# Patient Record
Sex: Male | Born: 1990 | Race: Black or African American | Hispanic: No | Marital: Single | Smoking: Former smoker
Health system: Southern US, Community
[De-identification: ages and names within clinical notes are randomized; demographics above are authoritative.]

## PROBLEM LIST (undated history)

## (undated) DIAGNOSIS — J45909 Unspecified asthma, uncomplicated: Secondary | ICD-10-CM

## (undated) DIAGNOSIS — J40 Bronchitis, not specified as acute or chronic: Secondary | ICD-10-CM

---
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
## 2013-07-08 ENCOUNTER — Emergency Department: Payer: Self-pay | Admitting: Emergency Medicine

## 2013-08-18 ENCOUNTER — Emergency Department: Payer: Self-pay | Admitting: Emergency Medicine

## 2013-08-18 LAB — URINALYSIS, COMPLETE
Bacteria: NONE SEEN
Bilirubin,UR: NEGATIVE
Blood: NEGATIVE
Leukocyte Esterase: NEGATIVE
Nitrite: NEGATIVE
Ph: 6 (ref 4.5–8.0)
Squamous Epithelial: NONE SEEN

## 2013-11-17 ENCOUNTER — Emergency Department: Payer: Self-pay | Admitting: Emergency Medicine

## 2014-04-14 ENCOUNTER — Emergency Department: Payer: Self-pay | Admitting: Emergency Medicine

## 2014-08-25 ENCOUNTER — Emergency Department: Payer: Self-pay | Admitting: Emergency Medicine

## 2014-08-25 LAB — URINALYSIS, COMPLETE
BLOOD: NEGATIVE
Bacteria: NONE SEEN
Bilirubin,UR: NEGATIVE
GLUCOSE, UR: NEGATIVE mg/dL (ref 0–75)
Ketone: NEGATIVE
LEUKOCYTE ESTERASE: NEGATIVE
NITRITE: NEGATIVE
PROTEIN: NEGATIVE
Ph: 6 (ref 4.5–8.0)
Specific Gravity: 1.026 (ref 1.003–1.030)
WBC UR: 1 /HPF (ref 0–5)

## 2014-08-28 LAB — GC/CHLAMYDIA PROBE AMP

## 2016-06-08 ENCOUNTER — Emergency Department: Payer: Self-pay

## 2016-06-08 ENCOUNTER — Emergency Department
Admission: EM | Admit: 2016-06-08 | Discharge: 2016-06-08 | Disposition: A | Discharge status: Home / Self Care | Payer: Self-pay | Attending: Emergency Medicine | Admitting: Emergency Medicine

## 2016-06-08 ENCOUNTER — Encounter: Payer: Self-pay | Admitting: Emergency Medicine

## 2016-06-08 DIAGNOSIS — F17210 Nicotine dependence, cigarettes, uncomplicated: Secondary | ICD-10-CM | POA: Insufficient documentation

## 2016-06-08 DIAGNOSIS — J069 Acute upper respiratory infection, unspecified: Secondary | ICD-10-CM | POA: Insufficient documentation

## 2016-06-08 DIAGNOSIS — B9789 Other viral agents as the cause of diseases classified elsewhere: Secondary | ICD-10-CM

## 2016-06-08 MED ORDER — PSEUDOEPH-BROMPHEN-DM 30-2-10 MG/5ML PO SYRP: 5.0000 mL | ORAL_SOLUTION | Freq: Four times a day (QID) | ORAL | 0 refills | Status: DC | PRN

## 2016-06-08 NOTE — ED Triage Notes (Signed)
Patient presents to the ED with cough and congestion x 3 weeks.  Patient states he has felt somewhat short of breath and has been using his mother's inhaler occasionally which improves his congestion.  Patient's respirations are even and not labored at this time. Patient speaking in full sentences without difficulty.  Breath sounds are clear bilaterally. Patient reports no history of asthma.  Patient reports coughing up yellow phlegm.  Denies fever.

## 2016-06-08 NOTE — Discharge Instructions (Signed)
Discontinue use of albuterol inhaler.

## 2016-06-08 NOTE — ED Provider Notes (Signed)
[facility] Emergency Department Provider Note   ____________________________________________   None    (approximate)  I have reviewed the triage vital signs and the nursing notes.   HISTORY  Chief Complaint Nasal Congestion and Cough    HPI Mathew Fitzpatrick is a 25 y.o. male patient complaining of cough congestion for 3 weeks. Patient stated this didn't admitting dyspnea. Patient stating he's been used his mother's inhaler which improved his condition. He denies previous history of asthma. Patient's mother has asthma. Patient admits to tobacco use. Patient also states has nasal congestion and intermittently runny nose. Patient say he has productive yellow cough. Patient denies any fever associated this complaint. No other palliative measures for this complaint.Patient denies any pain with this time.   History reviewed. No pertinent past medical history.  There are no active problems to display for this patient.   History reviewed. No pertinent surgical history.  Prior to Admission medications   Medication Sig Start Date End Date Taking? Authorizing Provider  brompheniramine-pseudoephedrine-DM 30-2-10 MG/5ML syrup Take 5 mLs by mouth 4 (four) times daily as needed. 06/08/16   Joni Reiningonald K Genessis Flanary, PA-C    Allergies Review of patient's allergies indicates no known allergies.  No family history on file.  Social History Social History  Substance Use Topics  . Smoking status: Current Every Day Smoker    Packs/day: 0.10    Types: Cigarettes  . Smokeless tobacco: Never Used  . Alcohol use Yes     Comment: daily    Review of Systems Constitutional: No fever/chills Eyes: No visual changes. ENT: No sore throat. Cardiovascular: Denies chest pain. Respiratory:Intermittent shortness of breath. Productive cough. Gastrointestinal: No abdominal pain.  No nausea, no vomiting.  No diarrhea.  No constipation. Genitourinary: Negative for  dysuria. Musculoskeletal: Negative for back pain. Skin: Negative for rash. Neurological: Negative for headaches, focal weakness or numbness.    ____________________________________________   PHYSICAL EXAM:  VITAL SIGNS: ED Triage Vitals  Enc Vitals Group     BP 06/08/16 1106 134/74     Pulse Rate 06/08/16 1106 66     Resp 06/08/16 1106 18     Temp 06/08/16 1106 98 F (36.7 C)     Temp Source 06/08/16 1106 Oral     SpO2 06/08/16 1106 96 %     Weight 06/08/16 1109 185 lb (83.9 kg)     Height 06/08/16 1109 5\' 10"  (1.778 m)     Head Circumference --      Peak Flow --      Pain Score --      Pain Loc --      Pain Edu? --      Excl. in GC? --     Constitutional: Alert and oriented. Well appearing and in no acute distress. Eyes: Conjunctivae are normal. PERRL. EOMI. Head: Atraumatic. Nose: No congestion/rhinnorhea. Mouth/Throat: Mucous membranes are moist.  Oropharynx non-erythematous. Neck: No stridor.  No cervical spine tenderness to palpation. Hematological/Lymphatic/Immunilogical: No cervical lymphadenopathy. Cardiovascular: Normal rate, regular rhythm. Grossly normal heart sounds.  Good peripheral circulation. Respiratory: Normal respiratory effort.  No retractions. Lungs CTAB. Gastrointestinal: Soft and nontender. No distention. No abdominal bruits. No CVA tenderness. Musculoskeletal: No lower extremity tenderness nor edema.  No joint effusions. Neurologic:  Normal speech and language. No gross focal neurologic deficits are appreciated. No gait instability. Skin:  Skin is warm, dry and intact. No rash noted. Psychiatric: Mood and affect are normal. Speech and behavior are normal.  ____________________________________________  LABS (all labs ordered are listed, but only abnormal results are displayed)  Labs Reviewed - No data to display ____________________________________________  EKG   ____________________________________________  RADIOLOGY  No acute  findings on chest x-ray. ____________________________________________   PROCEDURES  Procedure(s) performed:   Procedures  Critical Care performed: No  ____________________________________________   INITIAL IMPRESSION / ASSESSMENT AND PLAN / ED COURSE  Pertinent labs & imaging results that were available during my care of the patient were reviewed by me and considered in my medical decision making (see chart for details).  Upper respiratory infection. Discussed negative findings with patient. Patient given discharge care instructions. Patient advised to discontinue using mother's inhaler and take medication as directed.  Clinical Course     ____________________________________________   FINAL CLINICAL IMPRESSION(S) / ED DIAGNOSES  Final diagnoses:  Viral URI with cough      NEW MEDICATIONS STARTED DURING THIS VISIT:  New Prescriptions   BROMPHENIRAMINE-PSEUDOEPHEDRINE-DM 30-2-10 MG/5ML SYRUP    Take 5 mLs by mouth 4 (four) times daily as needed.     Note:  This document was prepared using Dragon voice recognition software and may include unintentional dictation errors.    Joni Reining, PA-C 06/08/16 1241    Governor Rooks, MD 06/08/16 1539

## 2016-06-08 NOTE — ED Notes (Signed)
Patient transported to X-ray by [facility].

## 2018-02-03 ENCOUNTER — Emergency Department: Payer: Self-pay

## 2018-02-03 ENCOUNTER — Other Ambulatory Visit: Payer: Self-pay

## 2018-02-03 ENCOUNTER — Encounter: Payer: Self-pay | Admitting: Emergency Medicine

## 2018-02-03 ENCOUNTER — Emergency Department
Admission: EM | Admit: 2018-02-03 | Discharge: 2018-02-03 | Disposition: A | Discharge status: Home / Self Care | Payer: Self-pay | Attending: Emergency Medicine | Admitting: Emergency Medicine

## 2018-02-03 DIAGNOSIS — F17210 Nicotine dependence, cigarettes, uncomplicated: Secondary | ICD-10-CM | POA: Insufficient documentation

## 2018-02-03 DIAGNOSIS — J209 Acute bronchitis, unspecified: Secondary | ICD-10-CM | POA: Insufficient documentation

## 2018-02-03 MED ORDER — ALBUTEROL SULFATE HFA 108 (90 BASE) MCG/ACT IN AERS: 2.0000 | INHALATION_SPRAY | Freq: Four times a day (QID) | RESPIRATORY_TRACT | 0 refills | Status: DC | PRN

## 2018-02-03 MED ORDER — BENZONATATE 100 MG PO CAPS: ORAL_CAPSULE | ORAL | 0 refills | Status: DC

## 2018-02-03 MED ORDER — PREDNISONE 10 MG PO TABS: ORAL_TABLET | ORAL | 0 refills | Status: DC

## 2018-02-03 NOTE — ED Notes (Signed)
First Nurse Note:  Patient states he wants to be checked for asthma and to see if he can get an inhaler.  States he had hx of asthma as a child.  Speaking in full sentences, NAD.

## 2018-02-03 NOTE — ED Notes (Signed)
See triage note  Presents with cough and congestion for a few days  No fever  States he felt better with use of family members inhaler

## 2018-02-03 NOTE — Discharge Instructions (Addendum)
Call 1 of the clinics listed on your discharge papers to establish primary care.  Call today as it may be 1 to 2 months before you actually get an appointment.  Began using prednisone as directed for the next 4 days, Tessalon Perls as needed for cough and albuterol inhaler 2 puffs 4 times a day as needed for cough and wheezing.

## 2018-02-03 NOTE — ED Triage Notes (Signed)
Pt to ED c/o cough. Pt states that he has chest congestion. Pt states that he wants to get an inhaler. Pt states that he used his mom's and that it helped and he felt much better after using. Pt is in NAD at this time.

## 2018-02-03 NOTE — ED Provider Notes (Signed)
[facility] Emergency Department Provider Note  ____________________________________________   First MD Initiated Contact with Patient 02/03/18 1031     (approximate)  I have reviewed the triage vital signs and the nursing notes.   HISTORY  Chief Complaint Cough  HPI Mathew Fitzpatrick is a 27 y.o. male is here with complaint of cough for the last 3 months.  Patient states that he has been using his mother's inhaler and taking an over-the-counter allergy medication.  He states that he had problems with "asthma" as a child but never got worked up for it.  Patient admits to smoking cigars but states he has not smoked any in the last 3 days.  He denies any fever, chills, nausea or vomiting.  He denies any sinus pain or pressure.   History reviewed. No pertinent past medical history.  There are no active problems to display for this patient.   History reviewed. No pertinent surgical history.  Prior to Admission medications   Medication Sig Start Date End Date Taking? Authorizing Provider  albuterol (PROVENTIL HFA;VENTOLIN HFA) 108 (90 Base) MCG/ACT inhaler Inhale 2 puffs into the lungs every 6 (six) hours as needed for wheezing or shortness of breath. 02/03/18   Tommi Rumps, PA-C  benzonatate (TESSALON PERLES) 100 MG capsule Take 1-2 every 8 hours prn coughing 02/03/18   Tommi Rumps, PA-C  predniSONE (DELTASONE) 10 MG tablet Take 3 tablets once a day for 4 days 02/03/18   Tommi Rumps, PA-C    Allergies Patient has no known allergies.  No family history on file.  Social History Social History   Tobacco Use  . Smoking status: Current Every Day Smoker    Packs/day: 0.10    Types: Cigarettes  . Smokeless tobacco: Never Used  Substance Use Topics  . Alcohol use: Yes    Comment: daily  . Drug use: Not Currently    Review of Systems Constitutional: No fever/chills Eyes: No visual changes. ENT: No sore throat.  Negative for ear  pain. Cardiovascular: Denies chest pain. Respiratory: Denies shortness of breath.  Positive cough, positive for wheezing. Gastrointestinal:   No nausea, no vomiting. Musculoskeletal: Negative for back pain. Skin: Negative for rash. Neurological: Negative for headaches, focal weakness or numbness. ___________________________________________   PHYSICAL EXAM:  VITAL SIGNS: ED Triage Vitals  Enc Vitals Group     BP 02/03/18 1014 125/77     Pulse Rate 02/03/18 1014 71     Resp 02/03/18 1014 16     Temp 02/03/18 1014 98 F (36.7 C)     Temp Source 02/03/18 1014 Oral     SpO2 02/03/18 1014 96 %     Weight 02/03/18 1015 190 lb (86.2 kg)     Height --      Head Circumference --      Peak Flow --      Pain Score 02/03/18 1015 0     Pain Loc --      Pain Edu? --      Excl. in GC? --    Constitutional: Alert and oriented. Well appearing and in no acute distress. Eyes: Conjunctivae are normal.  Head: Atraumatic. Nose: Minimal congestion/no rhinnorhea. Mouth/Throat: Mucous membranes are moist.  Oropharynx non-erythematous.  No posterior drainage noted. Neck: No stridor.   Hematological/Lymphatic/Immunilogical: No cervical lymphadenopathy. Cardiovascular: Normal rate, regular rhythm. Grossly normal heart sounds.  Good peripheral circulation. Respiratory: Normal respiratory effort.  No retractions. Lungs faint expiratory wheezes are heard throughout.  Patient is in no acute distress and is able to speak in complete sentences without any difficulties. Gastrointestinal: Soft and nontender. No distention.  Musculoskeletal: No lower extremity tenderness nor edema.  No joint effusions. Neurologic:  Normal speech and language. No gross focal neurologic deficits are appreciated. No gait instability. Skin:  Skin is warm, dry and intact. No rash noted. Psychiatric: Mood and affect are normal. Speech and behavior are normal.  ____________________________________________   LABS (all labs  ordered are listed, but only abnormal results are displayed)  Labs Reviewed - No data to display RADIOLOGY  ED MD interpretation: Chest x-ray is negative for infiltrate.  Official radiology report(s): Dg Chest 2 View  Result Date: 02/03/2018 CLINICAL DATA:  Cough for 3 months EXAM: CHEST - 2 VIEW COMPARISON:  06/07/2016 FINDINGS: The heart size and mediastinal contours are within normal limits. Both lungs are clear. The visualized skeletal structures are unremarkable. IMPRESSION: No active cardiopulmonary disease. Electronically Signed   By: Elige Ko   On: 02/03/2018 11:22    ____________________________________________   PROCEDURES  Procedure(s) performed: None  Procedures  Critical Care performed: No  ____________________________________________   INITIAL IMPRESSION / ASSESSMENT AND PLAN / ED COURSE Patient was reassured that chest x-ray was negative for infiltrate.  Currently is being treated for bronchitis.  Patient was given a prednisone dose along with Tessalon Perles as needed for cough.  He currently is taking his mother's albuterol inhaler therefore a prescription for his own was written.  Patient was given options to follow-up with a PCP with a list of providers accepting new patients in the area.  ____________________________________________   FINAL CLINICAL IMPRESSION(S) / ED DIAGNOSES  Final diagnoses:  Acute bronchitis, unspecified organism     ED Discharge Orders        Ordered    predniSONE (DELTASONE) 10 MG tablet     02/03/18 1136    albuterol (PROVENTIL HFA;VENTOLIN HFA) 108 (90 Base) MCG/ACT inhaler  Every 6 hours PRN     02/03/18 1136    benzonatate (TESSALON PERLES) 100 MG capsule     02/03/18 1136       Note:  This document was prepared using Dragon voice recognition software and may include unintentional dictation errors.    Tommi Rumps, PA-C 02/03/18 1425    Jene Every, MD 02/03/18 1438

## 2018-05-02 IMAGING — CR DG CHEST 2V
2 series · 2 of 2 positions shown · non-contrast
Comparison: 06/07/2016

CLINICAL DATA: Cough for 3 months

EXAM:
CHEST - 2 VIEW

[chest pa]
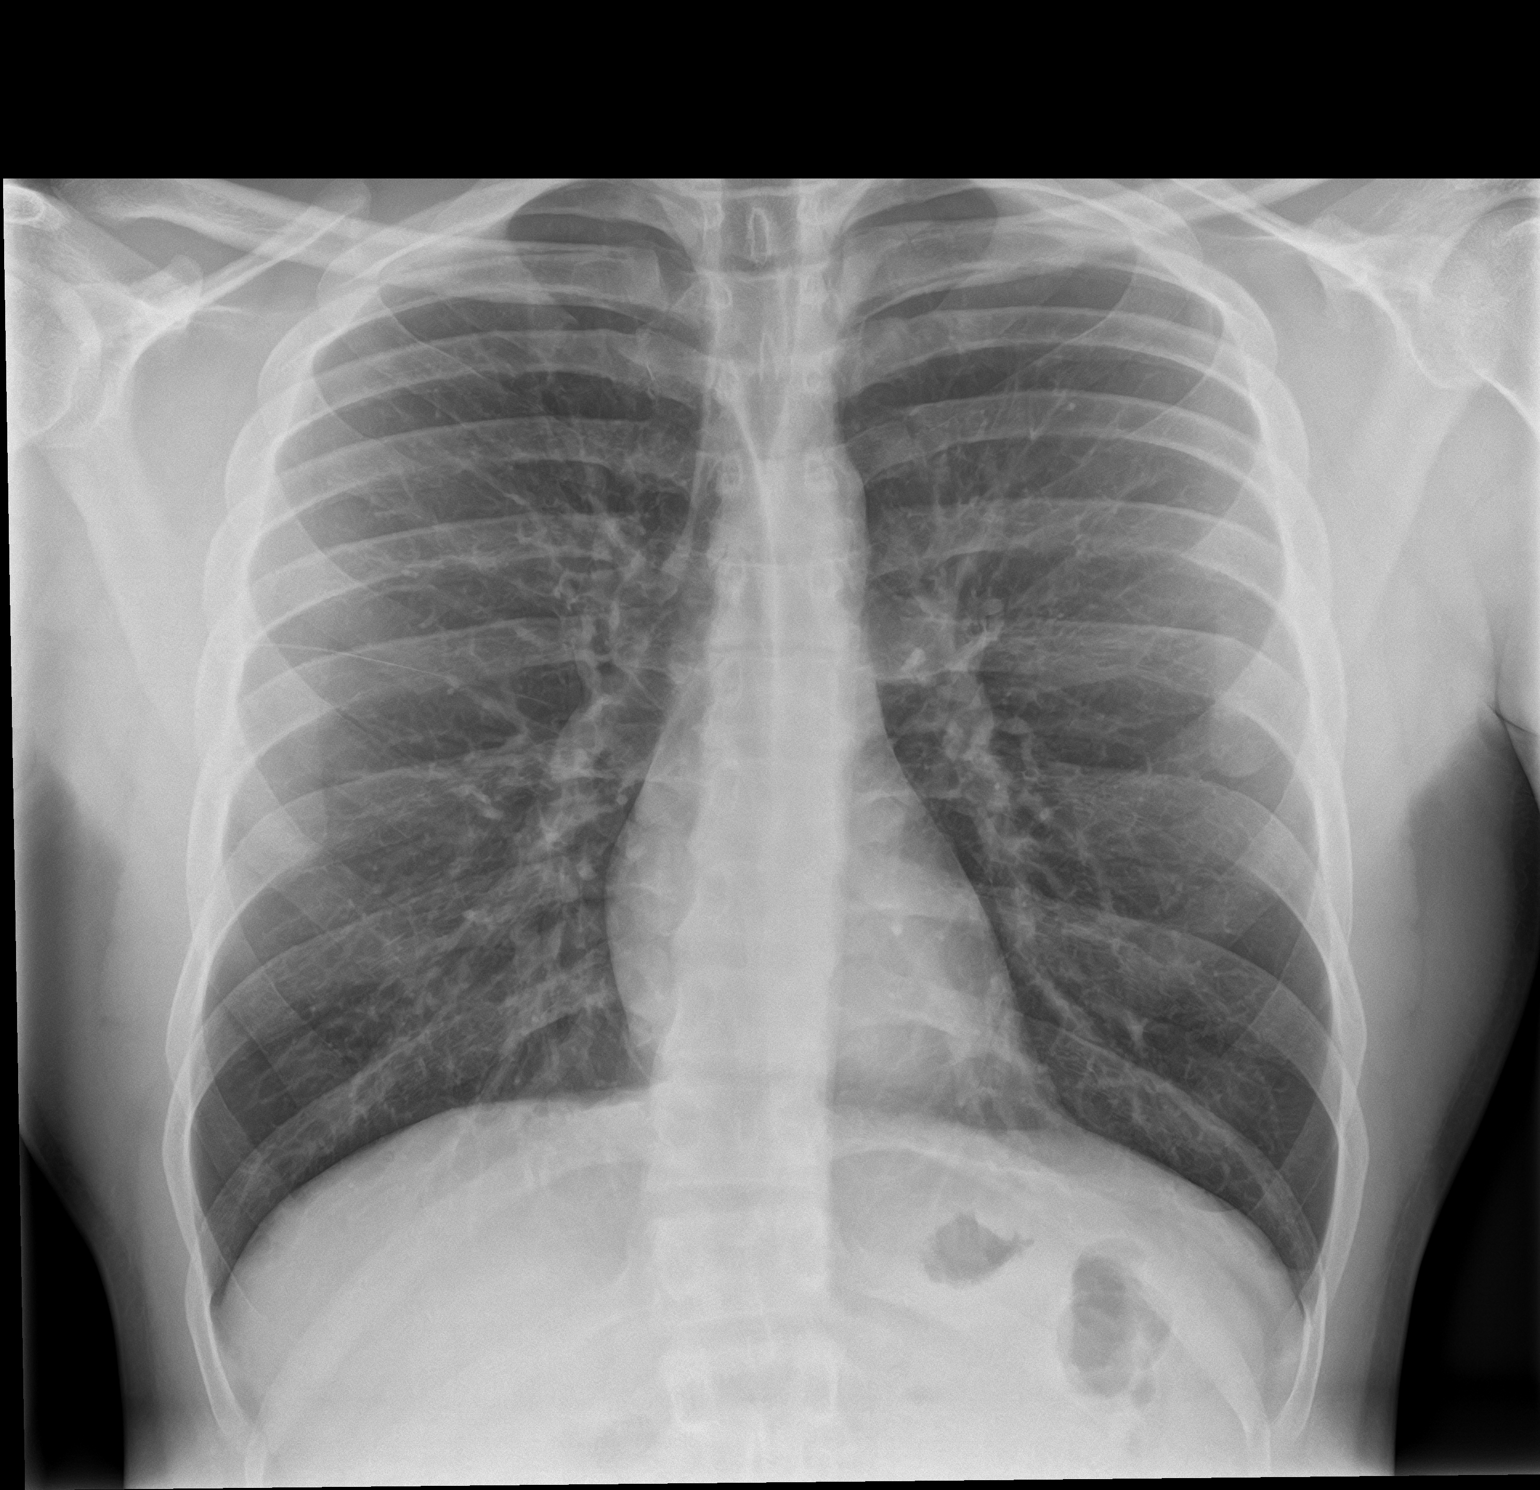

[chest lat]
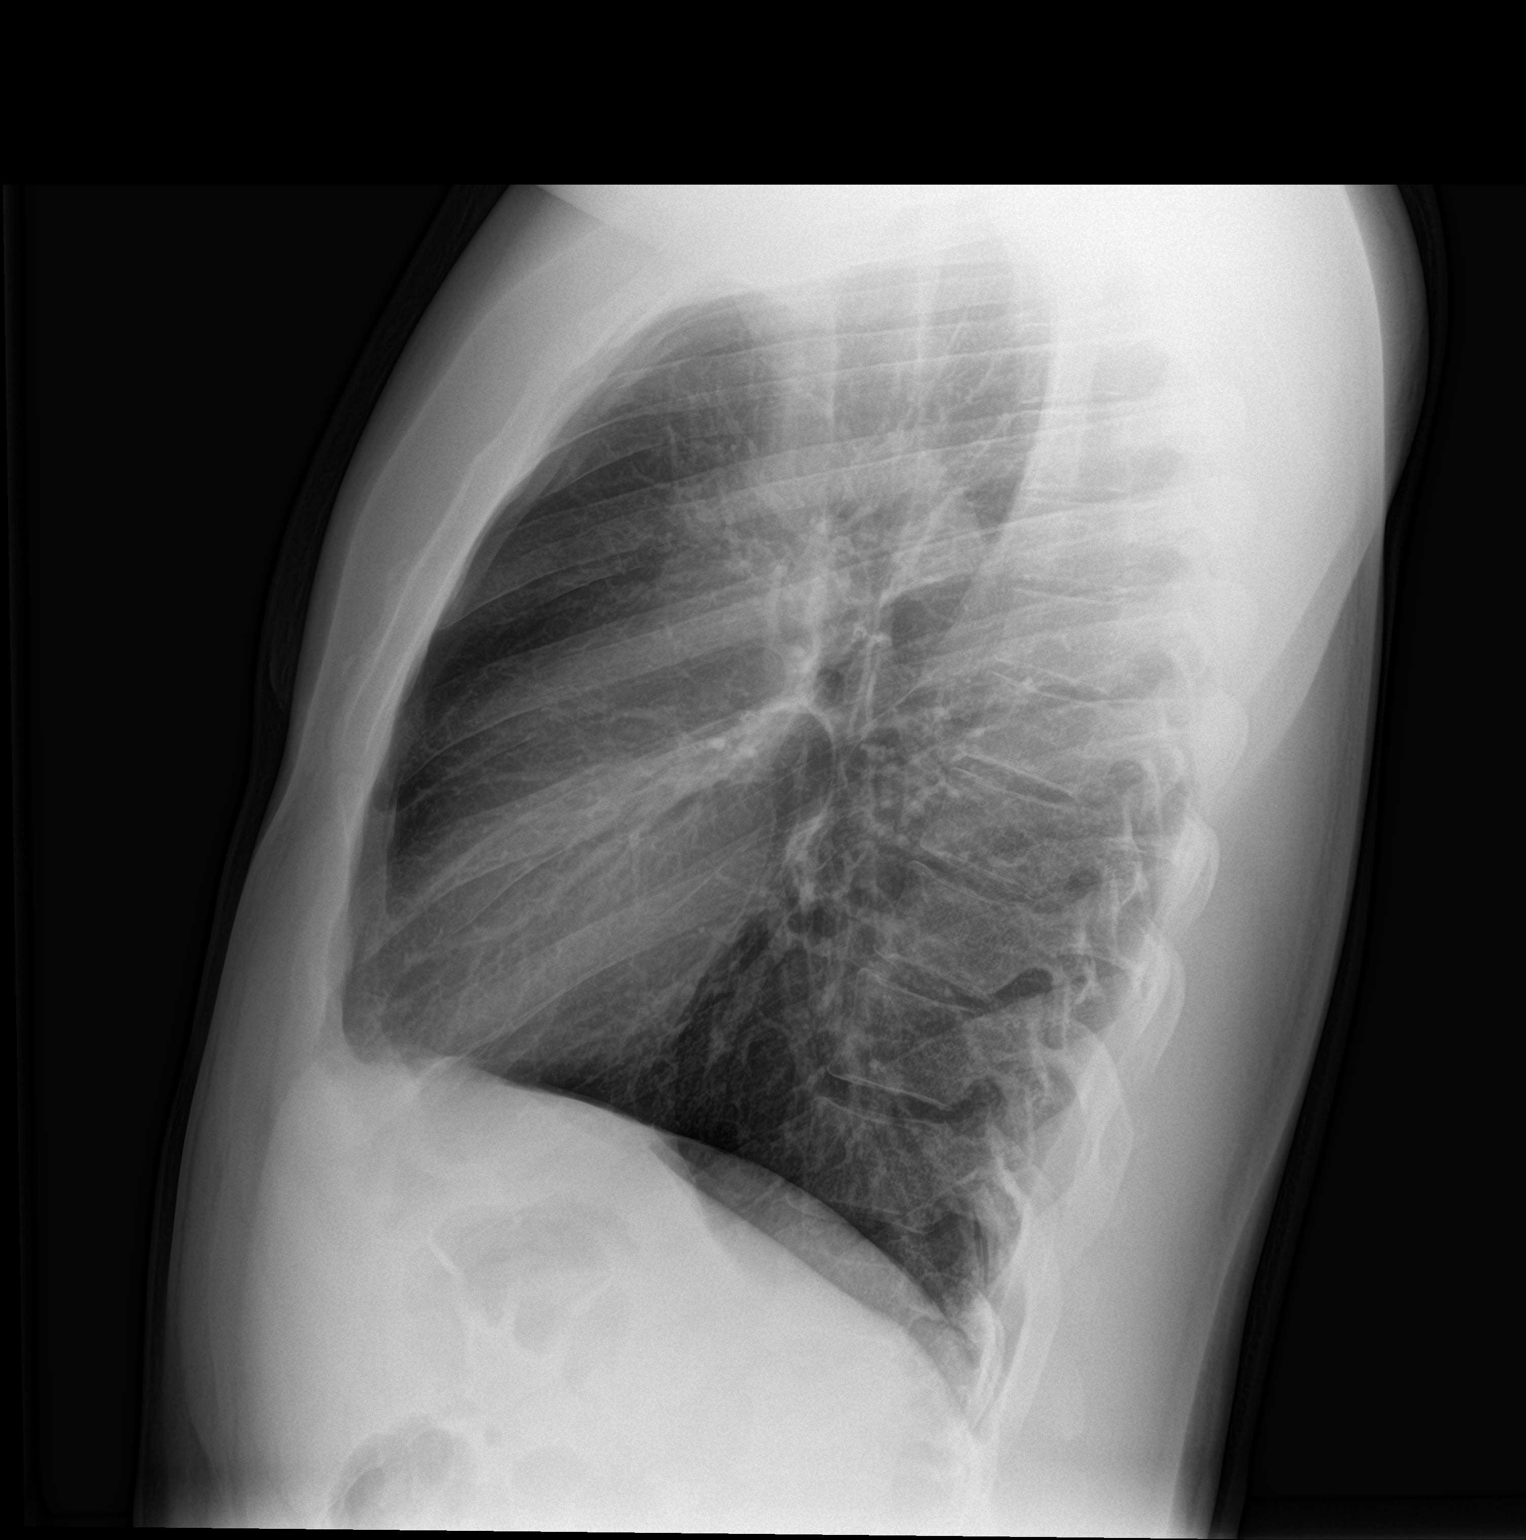

[2 of 2 positions shown; findings below may reference images not displayed]

FINDINGS: The heart size and mediastinal contours are within normal limits.
Both lungs are clear. The visualized skeletal structures are
unremarkable.
IMPRESSION: No active cardiopulmonary disease.

## 2018-12-11 ENCOUNTER — Other Ambulatory Visit: Payer: Self-pay

## 2018-12-11 ENCOUNTER — Emergency Department
Admission: EM | Admit: 2018-12-11 | Discharge: 2018-12-11 | Disposition: A | Discharge status: Home / Self Care | Payer: Self-pay | Attending: Emergency Medicine | Admitting: Emergency Medicine

## 2018-12-11 ENCOUNTER — Emergency Department: Payer: Self-pay

## 2018-12-11 ENCOUNTER — Encounter: Payer: Self-pay | Admitting: Emergency Medicine

## 2018-12-11 DIAGNOSIS — F17210 Nicotine dependence, cigarettes, uncomplicated: Secondary | ICD-10-CM | POA: Insufficient documentation

## 2018-12-11 DIAGNOSIS — R0981 Nasal congestion: Secondary | ICD-10-CM | POA: Insufficient documentation

## 2018-12-11 DIAGNOSIS — R05 Cough: Secondary | ICD-10-CM | POA: Insufficient documentation

## 2018-12-11 DIAGNOSIS — R51 Headache: Secondary | ICD-10-CM | POA: Insufficient documentation

## 2018-12-11 DIAGNOSIS — J101 Influenza due to other identified influenza virus with other respiratory manifestations: Secondary | ICD-10-CM | POA: Insufficient documentation

## 2018-12-11 LAB — INFLUENZA PANEL BY PCR (TYPE A & B)
INFLAPCR: NEGATIVE
INFLBPCR: POSITIVE — AB

## 2018-12-11 MED ORDER — GUAIFENESIN-CODEINE 100-10 MG/5ML PO SOLN: 5.0000 mL | ORAL | 0 refills | Status: DC | PRN

## 2018-12-11 NOTE — ED Triage Notes (Signed)
Pt here with c/o cough, sore throat, body aches for a few days now, NAD.

## 2018-12-11 NOTE — ED Notes (Signed)
Pt c/o muscle aches, cough, mucous, chills/sweats, headache, and fever for at least a week

## 2018-12-11 NOTE — Discharge Instructions (Signed)
Follow-up with your primary care provider or can no clinic acute care if any continued problems.  Take Tylenol or ibuprofen as needed for muscle aches, fever or headache.  Also guaifenesin with codeine is for cough and congestion.  Increase fluids to stay hydrated.  Avoid contact with other people as you are contagious.

## 2018-12-11 NOTE — ED Provider Notes (Signed)
[facility] Emergency Department Provider Note   ____________________________________________   First MD Initiated Contact with Patient 12/11/18 [phone]     (approximate)  I have reviewed the triage vital signs and the nursing notes.   HISTORY  Chief Complaint Cough; Generalized Body Aches; and Sore Throat   HPI Mathew Fitzpatrick is a 28 y.o. male presents to the ED with complaint of muscle aches, cough, congestion, headache and fever that started approximately 2 weeks ago but has been worse for the last week.  Patient has not taken any over-the-counter medication for his symptoms.  He reports that he occasionally smokes.  He is also here with a another family who is being seen and child is positive for influenza B.  His pain as a 8 out of 10.      History reviewed. No pertinent past medical history.  There are no active problems to display for this patient.   History reviewed. No pertinent surgical history.  Prior to Admission medications   Medication Sig Start Date End Date Taking? Authorizing Provider  albuterol (PROVENTIL HFA;VENTOLIN HFA) 108 (90 Base) MCG/ACT inhaler Inhale 2 puffs into the lungs every 6 (six) hours as needed for wheezing or shortness of breath. 02/03/18   Tommi Rumps, PA-C  guaiFENesin-codeine 100-10 MG/5ML syrup Take 5 mLs by mouth every 4 (four) hours as needed. 12/11/18   Tommi Rumps, PA-C    Allergies Patient has no known allergies.  No family history on file.  Social History Social History   Tobacco Use  . Smoking status: Current Every Day Smoker    Packs/day: 0.10    Types: Cigarettes  . Smokeless tobacco: Never Used  Substance Use Topics  . Alcohol use: Yes    Comment: daily  . Drug use: Not Currently    Review of Systems Constitutional: Subjective fever/chills Eyes: No visual changes. ENT: Positive sore throat. Cardiovascular: Denies chest pain. Respiratory: Denies shortness of breath.  Positive  cough. Gastrointestinal: No abdominal pain.  No nausea, no vomiting.  Musculoskeletal: Positive muscle aches. Skin: Negative for rash. Neurological: Negative for headaches, focal weakness or numbness. ___________________________________________   PHYSICAL EXAM:  VITAL SIGNS: ED Triage Vitals  Enc Vitals Group     BP 12/11/18 0928 127/87     Pulse Rate 12/11/18 0928 81     Resp 12/11/18 0928 18     Temp 12/11/18 0928 98.6 F (37 C)     Temp Source 12/11/18 0928 Oral     SpO2 12/11/18 0928 98 %     Weight 12/11/18 0929 200 lb (90.7 kg)     Height 12/11/18 0929 5\' 11"  (1.803 m)     Head Circumference --      Peak Flow --      Pain Score 12/11/18 0929 8     Pain Loc --      Pain Edu? --      Excl. in GC? --    Constitutional: Alert and oriented. Well appearing and in no acute distress. Eyes: Conjunctivae are normal.  Head: Atraumatic. Nose: Mild congestion/rhinnorhea.  TMs are dull bilaterally. Mouth/Throat: Mucous membranes are moist.  Oropharynx non-erythematous. Neck: No stridor.   Hematological/Lymphatic/Immunilogical: No cervical lymphadenopathy. Cardiovascular: Normal rate, regular rhythm. Grossly normal heart sounds.  Good peripheral circulation. Respiratory: Normal respiratory effort.  No retractions. Lungs faint infrequent expiratory wheeze heard throughout. Gastrointestinal: Soft and nontender. No distention. No abdominal bruits. No CVA tenderness. Musculoskeletal: His upper and lower extremities without any  difficulty.  Normal gait was noted. Neurologic:  Normal speech and language. No gross focal neurologic deficits are appreciated. No gait instability. Skin:  Skin is warm, dry and intact. No rash noted. Psychiatric: Mood and affect are normal. Speech and behavior are normal.  ____________________________________________   LABS (all labs ordered are listed, but only abnormal results are displayed)  Labs Reviewed  INFLUENZA PANEL BY PCR (TYPE A & B) -  Abnormal; Notable for the following components:      Result Value   Influenza B By PCR POSITIVE (*)    All other components within normal limits    RADIOLOGY   Official radiology report(s): Dg Chest 2 View  Result Date: 12/11/2018 CLINICAL DATA:  Pt reports positive flu test. Pt complains of cough, sore throat, body aches for a few days now, EXAM: CHEST - 2 VIEW COMPARISON:  02/03/2018 FINDINGS: The heart size and mediastinal contours are within normal limits. Both lungs are clear. No pleural effusion or pneumothorax. The visualized skeletal structures are unremarkable. IMPRESSION: No active cardiopulmonary disease. Electronically Signed   By: Amie Portland M.D.   On: 12/11/2018 11:42    ____________________________________________   PROCEDURES  Procedure(s) performed (including Critical Care):  Procedures   ____________________________________________   INITIAL IMPRESSION / ASSESSMENT AND PLAN / ED COURSE  As part of my medical decision making, I reviewed the following data within the electronic MEDICAL RECORD NUMBER Notes from prior ED visits and [place] Controlled Substance Database     Patient presents to the ED with complaint of feeling sick for the last 2 weeks but worse in the last week.  He is here with another family who tested positive for influenza B and insisted that he also be tested and was positive as well.  Chest x-ray was negative for bronchitis and pneumonia.  Patient was placed on guaifenesin with codeine as needed for cough and congestion.  He is encouraged to drink fluids and take Tylenol or ibuprofen as needed for body aches and fever.  He is to follow-up with his PCP or [facility] acute care if any continued problems.  ____________________________________________   FINAL CLINICAL IMPRESSION(S) / ED DIAGNOSES  Final diagnoses:  Influenza B     ED Discharge Orders         Ordered    guaiFENesin-codeine 100-10 MG/5ML syrup  Every 4 hours PRN     12/11/18  1202           Note:  This document was prepared using Dragon voice recognition software and may include unintentional dictation errors.    Tommi Rumps, PA-C 12/11/18 1211    Dionne Bucy, MD 12/11/18 [phone]

## 2019-04-20 ENCOUNTER — Encounter: Payer: Self-pay | Admitting: Urology

## 2019-04-20 ENCOUNTER — Other Ambulatory Visit: Payer: Self-pay

## 2019-04-20 ENCOUNTER — Encounter: Payer: Self-pay | Admitting: Emergency Medicine

## 2019-04-20 ENCOUNTER — Ambulatory Visit (INDEPENDENT_AMBULATORY_CARE_PROVIDER_SITE_OTHER): Payer: Self-pay | Admitting: Urology

## 2019-04-20 ENCOUNTER — Ambulatory Visit
Admission: EM | Admit: 2019-04-20 | Discharge: 2019-04-20 | Disposition: A | Discharge status: Home / Self Care | Payer: Self-pay | Attending: Urgent Care | Admitting: Urgent Care

## 2019-04-20 VITALS — BP 136/83 | HR 62 | Ht 70.0 in | Wt 198.4 lb

## 2019-04-20 DIAGNOSIS — S3994XA Unspecified injury of external genitals, initial encounter: Secondary | ICD-10-CM

## 2019-04-20 DIAGNOSIS — Z202 Contact with and (suspected) exposure to infections with a predominantly sexual mode of transmission: Secondary | ICD-10-CM

## 2019-04-20 DIAGNOSIS — N4889 Other specified disorders of penis: Secondary | ICD-10-CM

## 2019-04-20 LAB — URINALYSIS, COMPLETE (UACMP) WITH MICROSCOPIC
Bacteria, UA: NONE SEEN
Bilirubin Urine: NEGATIVE
Glucose, UA: NEGATIVE mg/dL
Hgb urine dipstick: NEGATIVE
Ketones, ur: NEGATIVE mg/dL
Leukocytes,Ua: NEGATIVE
Nitrite: NEGATIVE
Protein, ur: NEGATIVE mg/dL
RBC / HPF: NONE SEEN RBC/hpf (ref 0–5)
Specific Gravity, Urine: 1.025 (ref 1.005–1.030)
pH: 7 (ref 5.0–8.0)

## 2019-04-20 NOTE — Discharge Instructions (Signed)
LEAVE URGENT CARE AND GO STRAIGHT TO UROLOGIST OFFICE.   Honor Loh, FNP-C

## 2019-04-20 NOTE — ED Triage Notes (Signed)
Patient states that he is having some penis and groin pain

## 2019-04-20 NOTE — Progress Notes (Signed)
04/20/2019 5:55 PM   Mathew Fitzpatrick Harry Dec 01, 1990 [id number]  Referring provider: No referring provider defined for this encounter.  Chief Complaint  Patient presents with  . Possible penile fracture    HPI: 28 year old male sent over from [facility] for evaluation of a possible penile fracture.  He states he was having intercourse 4 days ago with his partner on top.  She came down on his penis twice pending it slightly.  He felt pain but did not hear an audible snap and did not lose the erection.  He was able to complete intercourse.  He has had mild to moderate penile discomfort over the last 4 days.  He denies voiding complaints or hematuria.  No penile swelling or bruising has been noted.   PMH: No past medical history on file.  Surgical History: No past surgical history on file.  Home Medications:  Allergies as of 04/20/2019   No Known Allergies     Medication List    as of April 20, 2019  5:55 PM   You have not been prescribed any medications.     Allergies: No Known Allergies  Family History: No family history on file.  Social History:  reports that he has been smoking cigarettes. He has been smoking about 0.10 packs per day. He has never used smokeless tobacco. He reports current alcohol use. He reports previous drug use.  ROS: UROLOGY Frequent Urination?: No Hard to postpone urination?: No Burning/pain with urination?: No Get up at night to urinate?: No Leakage of urine?: No Urine stream starts and stops?: No Trouble starting stream?: No Do you have to strain to urinate?: No Blood in urine?: No Urinary tract infection?: No Sexually transmitted disease?: No Injury to kidneys or bladder?: No Painful intercourse?: No Weak stream?: No Erection problems?: No Penile pain?: Yes  Gastrointestinal Nausea?: No Vomiting?: No Indigestion/heartburn?: No Diarrhea?: No Constipation?: No  Constitutional Fever: No Night sweats?: No Weight  loss?: No Fatigue?: No  Skin Skin rash/lesions?: No Itching?: No  Eyes Blurred vision?: No Double vision?: No  Ears/Nose/Throat Sore throat?: No Sinus problems?: No  Hematologic/Lymphatic Swollen glands?: No Easy bruising?: No  Cardiovascular Leg swelling?: No Chest pain?: No  Respiratory Cough?: No Shortness of breath?: No  Endocrine Excessive thirst?: No  Musculoskeletal Back pain?: No Joint pain?: No  Neurological Headaches?: No Dizziness?: No  Psychologic Depression?: No Anxiety?: No  Physical Exam: BP 136/83 (BP Location: Left Arm, Patient Position: Sitting, Cuff Size: Normal)   Pulse 62   Ht 5\' 10"  (1.778 m)   Wt 198 lb 6.4 oz (90 kg)   BMI 28.47 kg/m   Constitutional:  Alert and oriented, No acute distress. HEENT: [place] AT, moist mucus membranes.  Trachea midline, no masses. Cardiovascular: No clubbing, cyanosis, or edema. Respiratory: Normal respiratory effort, no increased work of breathing. GI: Abdomen is soft, nontender, nondistended, no abdominal masses GU: Penis is uncircumcised and normal in appearance.  No penile swelling or ecchymosis.  No palpable defects along the corpora or point tenderness along the corpora.  Testes descended bilaterally without masses or tenderness.  Cord/epididymis palpably normal bilaterally. Skin: No rashes, bruises or suspicious lesions. Neurologic: Grossly intact, no focal deficits, moving all 4 extremities. Psychiatric: Normal mood and affect.  Laboratory Data:  Urinalysis Dipstick/microscopy negative   Assessment & Plan:   Clinical history and exam not consistent with penile fracture.  Recommended abstaining for intercourse for 7 days and OTC NSAIDs prn.   Abbie Sons, MD  [place] [address] [place], [place] [address] [phone]

## 2019-04-21 LAB — CHLAMYDIA/NGC RT PCR (ARMC ONLY)??????????: Chlamydia Tr: NOT DETECTED

## 2019-04-21 LAB — CHLAMYDIA/NGC RT PCR (ARMC ONLY): N gonorrhoeae: NOT DETECTED
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
## 2019-05-25 ENCOUNTER — Ambulatory Visit: Payer: Self-pay | Admitting: Nurse Practitioner

## 2019-05-25 ENCOUNTER — Encounter: Payer: Self-pay | Admitting: Nurse Practitioner

## 2019-05-25 ENCOUNTER — Other Ambulatory Visit: Payer: Self-pay

## 2019-05-25 DIAGNOSIS — Z113 Encounter for screening for infections with a predominantly sexual mode of transmission: Secondary | ICD-10-CM

## 2019-05-25 LAB — GRAM STAIN

## 2019-05-25 NOTE — Progress Notes (Signed)
In for STD screening; has had some discomfort x ~2 wks. Debera Lat, RN

## 2019-05-25 NOTE — Progress Notes (Signed)
    STI clinic/screening visit  Subjective:  Mathew Fitzpatrick is a 28 y.o. male being seen today for an STI screening visit. The patient reports they do have symptoms.  Patient has the following medical conditions:  There are no active problems to display for this patient.    Chief Complaint  Patient presents with  . SEXUALLY TRANSMITTED DISEASE    Client c/o urethral irritation x 2 wks Desires STD testing today   Patient reports - urethral irritation  See flowsheet for further details and programmatic requirements.    The following portions of the patient's history were reviewed and updated as appropriate: allergies, current medications, past medical history, past social history, past surgical history and problem list.  Objective:  There were no vitals filed for this visit.  Physical Exam Constitutional:      Appearance: Normal appearance.  HENT:     Head: Normocephalic and atraumatic.     Comments: No nits or hair loss    Mouth/Throat:     Mouth: Mucous membranes are moist.     Pharynx: Oropharynx is clear. No oropharyngeal exudate or posterior oropharyngeal erythema.  Pulmonary:     Effort: Pulmonary effort is normal.  Abdominal:     General: Abdomen is flat.     Palpations: Abdomen is soft. There is no hepatomegaly or mass.     Tenderness: There is no abdominal tenderness.  Genitourinary:    Pubic Area: No rash or pubic lice.      Penis: Circumcised. Discharge (small amt clear discharge noted) present.      Scrotum/Testes: Normal.     Epididymis:     Right: Normal.     Left: Normal.  Lymphadenopathy:     Head:     Right side of head: No preauricular or posterior auricular adenopathy.     Left side of head: No preauricular or posterior auricular adenopathy.     Cervical: No cervical adenopathy.     Upper Body:     Right upper body: No supraclavicular or axillary adenopathy.     Left upper body: No supraclavicular or axillary adenopathy.     Lower Body: No  right inguinal adenopathy. No left inguinal adenopathy.  Skin:    General: Skin is warm and dry.     Findings: No rash.  Neurological:     Mental Status: He is alert and oriented to person, place, and time.       Assessment and Plan:  Mathew Fitzpatrick is a 28 y.o. male presenting to the [facility] Department for STI screening  1. Screening examination for STD (sexually transmitted disease) Await tests results - Gram stain - Gonococcus culture - HIV [place] [facility] - RPR  Client verbalizes understanding and is in agreement with plan of care    Return if symptoms worsen or fail to improve.  No future appointments.  Mathew Andreas, NP

## 2019-05-30 LAB — GONOCOCCUS CULTURE

## 2019-07-22 ENCOUNTER — Emergency Department
Admission: EM | Admit: 2019-07-22 | Discharge: 2019-07-22 | Disposition: A | Discharge status: Home / Self Care | Payer: Self-pay | Attending: Emergency Medicine | Admitting: Emergency Medicine

## 2019-07-22 ENCOUNTER — Encounter: Payer: Self-pay | Admitting: Emergency Medicine

## 2019-07-22 ENCOUNTER — Other Ambulatory Visit: Payer: Self-pay

## 2019-07-22 ENCOUNTER — Emergency Department: Payer: Self-pay

## 2019-07-22 DIAGNOSIS — F17210 Nicotine dependence, cigarettes, uncomplicated: Secondary | ICD-10-CM | POA: Insufficient documentation

## 2019-07-22 DIAGNOSIS — Z760 Encounter for issue of repeat prescription: Secondary | ICD-10-CM | POA: Insufficient documentation

## 2019-07-22 DIAGNOSIS — J4521 Mild intermittent asthma with (acute) exacerbation: Secondary | ICD-10-CM | POA: Insufficient documentation

## 2019-07-22 HISTORY — DX: Bronchitis, not specified as acute or chronic: J40

## 2019-07-22 MED ORDER — ALBUTEROL SULFATE HFA 108 (90 BASE) MCG/ACT IN AERS: 2.0000 | INHALATION_SPRAY | Freq: Four times a day (QID) | RESPIRATORY_TRACT | 3 refills | Status: DC | PRN

## 2019-07-22 MED ORDER — IPRATROPIUM-ALBUTEROL 0.5-2.5 (3) MG/3ML IN SOLN
3.0000 mL | Freq: Once | RESPIRATORY_TRACT | Status: AC
  Administered 2019-07-22: 3 mL via RESPIRATORY_TRACT
  Filled 2019-07-22: qty 3

## 2019-07-22 MED ORDER — PREDNISONE 20 MG PO TABS
60.0000 mg | ORAL_TABLET | Freq: Once | ORAL | Status: AC
  Administered 2019-07-22: 12:00:00 60 mg via ORAL
  Filled 2019-07-22: qty 3

## 2019-07-22 MED ORDER — ALBUTEROL SULFATE (2.5 MG/3ML) 0.083% IN NEBU: 2.5000 mg | INHALATION_SOLUTION | Freq: Four times a day (QID) | RESPIRATORY_TRACT | 12 refills | Status: DC | PRN

## 2019-07-22 MED ORDER — METHYLPREDNISOLONE 4 MG PO TBPK: ORAL_TABLET | ORAL | 0 refills | Status: DC

## 2019-07-22 NOTE — ED Triage Notes (Signed)
Says for about a month his wheezing is worse and he has to use the inhaler a lot.  He does not have pcp and comes here when it gets bad.  Says he struggles to breath all day and all night.  Is is in nad and able to speak with ease currently.

## 2019-07-22 NOTE — ED Provider Notes (Signed)
[facility] Emergency Department Provider Note   ____________________________________________   First MD Initiated Contact with Patient 07/22/19 1120     (approximate)  I have reviewed the triage vital signs and the nursing notes.   HISTORY  Chief Complaint Asthma    HPI Mathew Fitzpatrick is a 28 y.o. male patient presents with 1 month of increased wheezing.  Patient stated mild transient relief with inhaler.  Patient state he rations use of inhaler secondary to lack of insurance.  Patient states chest tightness which he rates as a 7/10.  Patient denies dyspnea at this time.  Patient has audible wheezing.  Patient requests medication refill.     Past Medical History:  Diagnosis Date  . Bronchitis     There are no active problems to display for this patient.   History reviewed. No pertinent surgical history.  Prior to Admission medications   Medication Sig Start Date End Date Taking? Authorizing Provider  albuterol (PROVENTIL) (2.5 MG/3ML) 0.083% nebulizer solution Take 3 mLs (2.5 mg total) by nebulization every 6 (six) hours as needed for wheezing or shortness of breath. 07/22/19   Joni Reining, PA-C  albuterol (VENTOLIN HFA) 108 (90 Base) MCG/ACT inhaler Inhale 2 puffs into the lungs every 6 (six) hours as needed for wheezing or shortness of breath. 07/22/19   Joni Reining, PA-C  methylPREDNISolone (MEDROL DOSEPAK) 4 MG TBPK tablet Take Tapered dose as directed 07/22/19   Joni Reining, PA-C    Allergies Shellfish allergy  No family history on file.  Social History Social History   Tobacco Use  . Smoking status: Current Every Day Smoker    Packs/day: 0.10    Types: Cigarettes  . Smokeless tobacco: Never Used  Substance Use Topics  . Alcohol use: Yes    Comment: daily  . Drug use: Not Currently    Review of Systems Constitutional: No fever/chills Eyes: No visual changes. ENT: No sore throat. Cardiovascular: Denies chest  pain. Respiratory:  shortness of breath. Gastrointestinal: No abdominal pain.  No nausea, no vomiting.  No diarrhea.  No constipation. Genitourinary: Negative for dysuria. Musculoskeletal: Negative for back pain. Skin: Negative for rash. Neurological: Negative for headaches, focal weakness or numbness.   ____________________________________________   PHYSICAL EXAM:  VITAL SIGNS: ED Triage Vitals  Enc Vitals Group     BP 07/22/19 1039 103/85     Pulse Rate 07/22/19 1039 86     Resp 07/22/19 1039 14     Temp 07/22/19 1039 98.7 F (37.1 C)     Temp Source 07/22/19 1039 Oral     SpO2 07/22/19 1039 95 %     Weight 07/22/19 1036 210 lb (95.3 kg)     Height 07/22/19 1036 5\' 11"  (1.803 m)     Head Circumference --      Peak Flow --      Pain Score 07/22/19 1035 7     Pain Loc --      Pain Edu? --      Excl. in GC? --    Constitutional: Alert and oriented. Well appearing and in no acute distress. Neck: No stridor.   Hematological/Lymphatic/Immunilogical: No cervical lymphadenopathy. Cardiovascular: Normal rate, regular rhythm. Grossly normal heart sounds.  Good peripheral circulation. Respiratory: Normal respiratory effort.  No retractions. Lungs mild expiratory wheezing. Musculoskeletal: No lower extremity tenderness nor edema.  No joint effusions. Neurologic:  Normal speech and language. No gross focal neurologic deficits are appreciated. No gait instability. Skin:  Skin is warm, dry and intact. No rash noted. Psychiatric: Mood and affect are normal. Speech and behavior are normal.  ____________________________________________   LABS (all labs ordered are listed, but only abnormal results are displayed)  Labs Reviewed - No data to display ____________________________________________  EKG   ____________________________________________  RADIOLOGY  ED MD interpretation:    Official radiology report(s): Dg Chest Portable 1 View  Result Date: 07/22/2019 CLINICAL  DATA:  Shortness of breath. EXAM: PORTABLE CHEST 1 VIEW COMPARISON:  December 11, 2018. FINDINGS: The heart size and mediastinal contours are within normal limits. Both lungs are clear. No pneumothorax or pleural effusion is noted. The visualized skeletal structures are unremarkable. IMPRESSION: No active disease. Electronically Signed   By: Marijo Conception M.D.   On: 07/22/2019 11:55    ____________________________________________   PROCEDURES  Procedure(s) performed (including Critical Care):  Procedures   ____________________________________________   INITIAL IMPRESSION / ASSESSMENT AND PLAN / ED COURSE  As part of my medical decision making, I reviewed the following data within the [place] was evaluated in Emergency Department on 07/22/2019 for the symptoms described in the history of present illness. He was evaluated in the context of the global COVID-19 pandemic, which necessitated consideration that the patient might be at risk for infection with the SARS-CoV-2 virus that causes COVID-19. Institutional protocols and algorithms that pertain to the evaluation of patients at risk for COVID-19 are in a state of rapid change based on information released by regulatory bodies including the CDC and federal and state organizations. These policies and algorithms were followed during the patient's care in the ED.  Patient presents with mild wheezing secondary to asthma.  Patient given DuoNeb prior to departure.  Patient medication were refilled.  Patient advised [facility] with [facility].      ____________________________________________   FINAL CLINICAL IMPRESSION(S) / ED DIAGNOSES  Final diagnoses:  Mild intermittent asthma with exacerbation     ED Discharge Orders         Ordered    albuterol (PROVENTIL) (2.5 MG/3ML) 0.083% nebulizer solution  Every 6 hours PRN     07/22/19 1201    methylPREDNISolone (MEDROL DOSEPAK) 4  MG TBPK tablet     07/22/19 1201    albuterol (VENTOLIN HFA) 108 (90 Base) MCG/ACT inhaler  Every 6 hours PRN     07/22/19 1201           Note:  This document was prepared using Dragon voice recognition software and may include unintentional dictation errors.    Sable Feil, PA-C 07/22/19 1205    Blake Divine, MD 07/22/19 [phone]

## 2019-07-22 NOTE — ED Notes (Addendum)
Pt refused covid test. Pt states that he does not need one due to the fact that his problem is bronchitis.

## 2019-07-22 NOTE — ED Notes (Signed)
See triage note  Presents with some wheezing and SOB  Hx of asthma  States he has been using inhalers with min relief  States he did have sore throat yesterday

## 2019-07-23 ENCOUNTER — Other Ambulatory Visit: Payer: Self-pay

## 2019-07-23 DIAGNOSIS — Z20822 Contact with and (suspected) exposure to covid-19: Secondary | ICD-10-CM

## 2019-07-25 LAB — NOVEL CORONAVIRUS, NAA: SARS-CoV-2, NAA: NOT DETECTED

## 2020-11-08 ENCOUNTER — Encounter: Payer: Self-pay | Admitting: Family Medicine

## 2020-11-08 ENCOUNTER — Ambulatory Visit: Payer: Self-pay | Admitting: Family Medicine

## 2020-11-08 DIAGNOSIS — Z113 Encounter for screening for infections with a predominantly sexual mode of transmission: Secondary | ICD-10-CM

## 2020-11-08 LAB — GRAM STAIN

## 2020-11-08 NOTE — Progress Notes (Signed)
   [facility] Department STI clinic/screening visit  Subjective:  Krishawn R Mofield is a 30 y.o. male being seen today for an STI screening visit. The patient reports they do not have symptoms.    Patient has the following medical conditions:  There are no problems to display for this patient.    Chief Complaint  Patient presents with  . SEXUALLY TRANSMITTED DISEASE    STD Screening    HPI  Patient reports here for screening, denies any s/sx or contacts to STI.    See flowsheet for further details and programmatic requirements.    The following portions of the patient's history were reviewed and updated as appropriate: allergies, current medications, past medical history, past social history, past surgical history and problem list.  Objective:  There were no vitals filed for this visit.  Physical Exam Constitutional:      Appearance: Normal appearance.  HENT:     Head: Normocephalic.     Mouth/Throat:     Mouth: Mucous membranes are moist.     Pharynx: Oropharynx is clear. No oropharyngeal exudate.  Pulmonary:     Effort: Pulmonary effort is normal.  Genitourinary:    Comments: No lice, nits, or pest, no lesions or odor discharge.  Denies pain or tenderness with paplation of testicles.  No lesions, ulcers or masses present.   Musculoskeletal:     Cervical back: Normal range of motion and neck supple.  Lymphadenopathy:     Cervical: No cervical adenopathy.  Skin:    General: Skin is warm and dry.     Findings: No bruising, erythema, lesion or rash.  Neurological:     Mental Status: He is alert and oriented to person, place, and time.  Psychiatric:        Mood and Affect: Mood normal.        Behavior: Behavior normal.       Assessment and Plan:  Zedric R Nigh is a 30 y.o. male presenting to the [facility] Department for STI screening  1. Screening examination for venereal disease - Gonococcus culture - Gram stain - Syphilis  Serology, [place] [facility] - HIV [place] [facility]  Patient does not have STI symptoms Patient accepted all screenings including  Gram stain, GC culture and bloodwork for HIV/RPR.  Patient meets criteria for HepB screening? Yes. Ordered? No - declined  Patient meets criteria for HepC screening? Yes. Ordered? No - declined  Recommended condom use with all sex Discussed importance of condom use for STI prevent   Nursing Treat gram stain per standing order  Discussed time line for [facility] results and that patient will be called with positive results and encouraged patient to call if he had not heard in 2 weeks Recommended returning for continued or worsening symptoms.     Return for as needed.  No future appointments.  Wendi Snipes, FNP

## 2020-11-08 NOTE — Progress Notes (Signed)
Gram stain reviewed, no treatment indicated..Radin Raptis Brewer-Jensen, RN  ?

## 2020-11-13 LAB — GONOCOCCUS CULTURE

## 2020-11-15 LAB — HM HIV SCREENING LAB: HM HIV Screening: NEGATIVE

## 2020-11-27 ENCOUNTER — Encounter: Payer: Self-pay | Admitting: Student

## 2021-03-08 ENCOUNTER — Emergency Department: Payer: Self-pay

## 2021-03-08 ENCOUNTER — Other Ambulatory Visit: Payer: Self-pay

## 2021-03-08 ENCOUNTER — Emergency Department
Admission: EM | Admit: 2021-03-08 | Discharge: 2021-03-08 | Disposition: A | Discharge status: Home / Self Care | Payer: Self-pay | Attending: Emergency Medicine | Admitting: Emergency Medicine

## 2021-03-08 DIAGNOSIS — Z87891 Personal history of nicotine dependence: Secondary | ICD-10-CM | POA: Insufficient documentation

## 2021-03-08 DIAGNOSIS — Z7951 Long term (current) use of inhaled steroids: Secondary | ICD-10-CM | POA: Insufficient documentation

## 2021-03-08 DIAGNOSIS — R0789 Other chest pain: Secondary | ICD-10-CM

## 2021-03-08 DIAGNOSIS — J4521 Mild intermittent asthma with (acute) exacerbation: Secondary | ICD-10-CM | POA: Insufficient documentation

## 2021-03-08 HISTORY — DX: Unspecified asthma, uncomplicated: J45.909

## 2021-03-08 LAB — BASIC METABOLIC PANEL
Anion gap: 9 (ref 5–15)
BUN: 13 mg/dL (ref 6–20)
CO2: 27 mmol/L (ref 22–32)
Calcium: 9.3 mg/dL (ref 8.9–10.3)
Chloride: 103 mmol/L (ref 98–111)
Creatinine, Ser: 1.16 mg/dL (ref 0.61–1.24)
GFR, Estimated: >60 mL/min (ref >60)
Glucose, Bld: 100 mg/dL — ABNORMAL HIGH (ref 70–99)
Potassium: 4.3 mmol/L (ref 3.5–5.1)
Sodium: 139 mmol/L (ref 135–145)

## 2021-03-08 LAB — TROPONIN I (HIGH SENSITIVITY)
Troponin I (High Sensitivity): 3 ng/L (ref <18)
Troponin I (High Sensitivity): <2 ng/L (ref <18)

## 2021-03-08 LAB — CBC
HCT: 45.2 % (ref 39.0–52.0)
Hemoglobin: 15.7 g/dL (ref 13.0–17.0)
MCH: 31.9 pg (ref 26.0–34.0)
MCHC: 34.7 g/dL (ref 30.0–36.0)
MCV: 91.9 fL (ref 80.0–100.0)
Platelets: 265 10*3/uL (ref 150–400)
RBC: 4.92 MIL/uL (ref 4.22–5.81)
RDW: 12.3 % (ref 11.5–15.5)
WBC: 3.1 10*3/uL — ABNORMAL LOW (ref 4.0–10.5)
nRBC: 0 % (ref 0.0–0.2)

## 2021-03-08 MED ORDER — ALBUTEROL SULFATE HFA 108 (90 BASE) MCG/ACT IN AERS
2.0000 | INHALATION_SPRAY | RESPIRATORY_TRACT | Status: DC | PRN
  Filled 2021-03-08 (×2): qty 6.7

## 2021-03-08 MED ORDER — ALBUTEROL SULFATE HFA 108 (90 BASE) MCG/ACT IN AERS: 2.0000 | INHALATION_SPRAY | RESPIRATORY_TRACT | 2 refills | Status: DC | PRN

## 2021-03-08 MED ORDER — ALBUTEROL SULFATE HFA 108 (90 BASE) MCG/ACT IN AERS
2.0000 | INHALATION_SPRAY | Freq: Once | RESPIRATORY_TRACT | Status: DC
Start: 2021-03-08 — End: 2021-03-08
  Administered 2021-03-08: 2 via RESPIRATORY_TRACT

## 2021-03-08 MED ORDER — PREDNISONE 20 MG PO TABS
60.0000 mg | ORAL_TABLET | Freq: Once | ORAL | Status: AC
  Administered 2021-03-08: 60 mg via ORAL
  Filled 2021-03-08: qty 3

## 2021-03-08 MED ORDER — PREDNISONE 20 MG PO TABS: 40.0000 mg | ORAL_TABLET | Freq: Every day | ORAL | 0 refills | Status: AC

## 2021-03-08 MED ORDER — HYDROCODONE-ACETAMINOPHEN 5-325 MG PO TABS
2.0000 | ORAL_TABLET | Freq: Once | ORAL | Status: AC
  Administered 2021-03-08: 2 via ORAL
  Filled 2021-03-08: qty 2

## 2021-03-08 MED ORDER — FLUTICASONE PROPIONATE HFA 110 MCG/ACT IN AERO: 1.0000 | INHALATION_SPRAY | Freq: Two times a day (BID) | RESPIRATORY_TRACT | 1 refills | Status: DC

## 2021-03-08 NOTE — ED Notes (Signed)
Called pharmacy to send neb treatment

## 2021-03-08 NOTE — ED Provider Notes (Signed)
[facility] Emergency Department Provider Note  ____________________________________________   Event Date/Time   First MD Initiated Contact with Patient 03/08/21 1032     (approximate)  I have reviewed the triage vital signs and the nursing notes.   HISTORY  Chief Complaint Asthma    HPI Mathew Fitzpatrick is a 30 y.o. male  With h/o asthma here with  Cough, SOB. Pt reports that for the past 2 days, he has run out of his albuterol inhaler. He's had associated cough, wheezing, and SOB. He normally uses his albuterol several times daily. He does not have a PCP and is not on any controller medications. Reports that his sx are currently exacerbated by allergies, and he has had a dry cough as well. No fever, chills. No known COVID exposures. Sx improve with inhaler but he's been out x 2 days as mentioned. No PCP.        Past Medical History:  Diagnosis Date  . Asthma   . Bronchitis     There are no problems to display for this patient.   History reviewed. No pertinent surgical history.  Prior to Admission medications   Medication Sig Start Date End Date Taking? Authorizing Provider  albuterol (VENTOLIN HFA) 108 (90 Base) MCG/ACT inhaler Inhale 2 puffs into the lungs every 4 (four) hours as needed for wheezing or shortness of breath. 03/08/21  Yes Shaune Pollack, MD  fluticasone (FLOVENT HFA) 110 MCG/ACT inhaler Inhale 1 puff into the lungs in the morning and at bedtime. 03/08/21  Yes Shaune Pollack, MD  predniSONE (DELTASONE) 20 MG tablet Take 2 tablets (40 mg total) by mouth daily for 5 days. 03/08/21 03/13/21 Yes Shaune Pollack, MD  albuterol (PROVENTIL) (2.5 MG/3ML) 0.083% nebulizer solution Take 3 mLs (2.5 mg total) by nebulization every 6 (six) hours as needed for wheezing or shortness of breath. 07/22/19   Joni Reining, PA-C  albuterol (VENTOLIN HFA) 108 (90 Base) MCG/ACT inhaler Inhale 2 puffs into the lungs every 6 (six) hours as needed for wheezing  or shortness of breath. 07/22/19   Joni Reining, PA-C  methylPREDNISolone (MEDROL DOSEPAK) 4 MG TBPK tablet Take Tapered dose as directed 07/22/19   Joni Reining, PA-C    Allergies Shellfish allergy  No family history on file.  Social History Social History   Tobacco Use  . Smoking status: Former Smoker    Packs/day: 0.10    Types: Cigarettes, Cigars    Quit date: 11/09/2015    Years since quitting: 5.3  . Smokeless tobacco: Never Used  Substance Use Topics  . Alcohol use: Yes    Comment: daily  . Drug use: Not Currently    Review of Systems  Review of Systems  Constitutional: Positive for fatigue. Negative for chills and fever.  HENT: Negative for sore throat.   Respiratory: Positive for cough, shortness of breath and wheezing.   Cardiovascular: Negative for chest pain.  Gastrointestinal: Negative for abdominal pain.  Genitourinary: Negative for flank pain.  Musculoskeletal: Negative for neck pain.  Skin: Negative for rash and wound.  Allergic/Immunologic: Negative for immunocompromised state.  Neurological: Negative for weakness and numbness.  Hematological: Does not bruise/bleed easily.  All other systems reviewed and are negative.    ____________________________________________  PHYSICAL EXAM:      VITAL SIGNS: ED Triage Vitals  Enc Vitals Group     BP 03/08/21 0949 128/78     Pulse Rate 03/08/21 0949 (!) 52     Resp  03/08/21 0949 20     Temp 03/08/21 0949 98.3 F (36.8 C)     Temp Source 03/08/21 0949 Oral     SpO2 03/08/21 0949 98 %     Weight 03/08/21 0950 205 lb (93 kg)     Height 03/08/21 0950 5\' 11"  (1.803 m)     Head Circumference --      Peak Flow --      Pain Score --      Pain Loc --      Pain Edu? --      Excl. in GC? --      Physical Exam Vitals and nursing note reviewed.  Constitutional:      General: He is not in acute distress.    Appearance: He is well-developed.  HENT:     Head: Normocephalic and atraumatic.  Eyes:      Conjunctiva/sclera: Conjunctivae normal.  Cardiovascular:     Rate and Rhythm: Normal rate and regular rhythm.     Heart sounds: Normal heart sounds. No murmur heard. No friction rub.  Pulmonary:     Effort: Pulmonary effort is normal. No respiratory distress.     Breath sounds: Wheezing (mild, expiratory) present. No rales.  Abdominal:     General: There is no distension.     Palpations: Abdomen is soft.     Tenderness: There is no abdominal tenderness.  Musculoskeletal:     Cervical back: Neck supple.  Skin:    General: Skin is warm.     Capillary Refill: Capillary refill takes less than 2 seconds.  Neurological:     Mental Status: He is alert and oriented to person, place, and time.     Motor: No abnormal muscle tone.       ____________________________________________   LABS (all labs ordered are listed, but only abnormal results are displayed)  Labs Reviewed  BASIC METABOLIC PANEL - Abnormal; Notable for the following components:      Result Value   Glucose, Bld 100 (*)    All other components within normal limits  CBC - Abnormal; Notable for the following components:   WBC 3.1 (*)    All other components within normal limits  TROPONIN I (HIGH SENSITIVITY)  TROPONIN I (HIGH SENSITIVITY)    ____________________________________________  EKG: Sinus bradycardia, VR 58. PR 166, QRS 84, QTc 384. Non specific TW changes, possibly LVH related. No acute ST elevations. ________________________________________  RADIOLOGY All imaging, including plain films, CT scans, and ultrasounds, independently reviewed by me, and interpretations confirmed via formal radiology reads.  ED MD interpretation:   CXR: Clear  Official radiology report(s): DG Chest 2 View  Result Date: 03/08/2021 CLINICAL DATA:  Chest pain, tightness, cough, history of asthma EXAM: CHEST - 2 VIEW COMPARISON:  07/22/2019 FINDINGS: The heart size and mediastinal contours are within normal limits. Both lungs  are clear. The visualized skeletal structures are unremarkable. IMPRESSION: No active cardiopulmonary disease. Electronically Signed   By: 07/24/2019.  Shick M.D.   On: 03/08/2021 10:17    ____________________________________________  PROCEDURES   Procedure(s) performed (including Critical Care):  Procedures  ____________________________________________  INITIAL IMPRESSION / MDM / ASSESSMENT AND PLAN / ED COURSE  As part of my medical decision making, I reviewed the following data within the electronic MEDICAL RECORD NUMBER Nursing notes reviewed and incorporated, Old chart reviewed, Notes from prior ED visits, and [place] Controlled Substance Database       *Michiah R Dortch was evaluated in Emergency Department on 03/08/2021 for  the symptoms described in the history of present illness. He was evaluated in the context of the global COVID-19 pandemic, which necessitated consideration that the patient might be at risk for infection with the SARS-CoV-2 virus that causes COVID-19. Institutional protocols and algorithms that pertain to the evaluation of patients at risk for COVID-19 are in a state of rapid change based on information released by regulatory bodies including the CDC and federal and state organizations. These policies and algorithms were followed during the patient's care in the ED.  Some ED evaluations and interventions may be delayed as a result of limited staffing during the pandemic.*     Medical Decision Making:  30 yo M here with cough, wheezing, SOB. Suspect acute on chronic asthma exacerbation. Pt has fairly persistent sx requiring albuterol daily, yet has not seen a PCP. CXR reviewed by me, shows no PNA, PTX. EKG shows some nonspecific TWI which may be LVH related - trop neg x 2, do not suspect ACS. He is PERC negative. No hypoxia or signs of PE. Labs otherwise unremarkable - no anemia, BMP unremarkable. Pt given albuterol, prednisone, and analgesia. Suspect his reports CP is from coughing  and bronchospasm. Will treat with steroids, albuterol and start a controller inhaler. Discussed importance of PCP f/u for his asthma.  ____________________________________________  FINAL CLINICAL IMPRESSION(S) / ED DIAGNOSES  Final diagnoses:  Mild intermittent asthma with exacerbation  Atypical chest pain     MEDICATIONS GIVEN DURING THIS VISIT:  Medications  albuterol (VENTOLIN HFA) 108 (90 Base) MCG/ACT inhaler 2 puff (has no administration in time range)  predniSONE (DELTASONE) tablet 60 mg (60 mg Oral Given 03/08/21 1102)  HYDROcodone-acetaminophen (NORCO/VICODIN) 5-325 MG per tablet 2 tablet (2 tablets Oral Given 03/08/21 1102)     ED Discharge Orders         Ordered    predniSONE (DELTASONE) 20 MG tablet  Daily        03/08/21 1125    albuterol (VENTOLIN HFA) 108 (90 Base) MCG/ACT inhaler  Every 4 hours PRN        03/08/21 1125    fluticasone (FLOVENT HFA) 110 MCG/ACT inhaler  2 times daily        03/08/21 1125           Note:  This document was prepared using Dragon voice recognition software and may include unintentional dictation errors.   Shaune Pollack, MD 03/08/21 1302

## 2021-03-08 NOTE — ED Triage Notes (Signed)
Pt states he is out of his prescription for his inhaler for asthma- pt has had increased cough and SHOB and tightness in his chest

## 2021-05-09 ENCOUNTER — Ambulatory Visit: Payer: Self-pay | Admitting: *Deleted

## 2021-05-09 NOTE — Telephone Encounter (Signed)
Routing to admin to see about being scheduled.

## 2021-05-09 NOTE — Telephone Encounter (Signed)
Patient calls requesting albuterol inhaler for his shortness of breath he has experienced for 2 months-03/29/21 he was seen in the ED and prescribed Albuterol Inhaler with a refill. Patient reported he has hx of asthma No PCP at this time. Requesting new patient appointment. General Care Advice provided-including if with moderate to severe SOB-go to UC for immediate evaluation and treatment.   Reason for Disposition  [1] MILD longstanding difficulty breathing AND [2]  SAME as normal  Answer Assessment - Initial Assessment Questions 1. RESPIRATORY STATUS: "Describe your breathing?" (e.g., wheezing, shortness of breath, unable to speak, severe coughing)      wheezing 2. ONSET: "When did this breathing problem begin?"      2 months now.  3. PATTERN "Does the difficult breathing come and go, or has it been constant since it started?"      Comes and goes 4. SEVERITY: "How bad is your breathing?" (e.g., mild, moderate, severe)    - MILD: No SOB at rest, mild SOB with walking, speaks normally in sentences, can lie down, no retractions, pulse < 100.    - MODERATE: SOB at rest, SOB with minimal exertion and prefers to sit, cannot lie down flat, speaks in phrases, mild retractions, audible wheezing, pulse 100-120.    - SEVERE: Very SOB at rest, speaks in single words, struggling to breathe, sitting hunched forward, retractions, pulse > 120      moderate 5. RECURRENT SYMPTOM: "Have you had difficulty breathing before?" If Yes, ask: "When was the last time?" and "What happened that time?"      Yes, due to asthma 6. CARDIAC HISTORY: "Do you have any history of heart disease?" (e.g., heart attack, angina, bypass surgery, angioplasty)      no 7. LUNG HISTORY: "Do you have any history of lung disease?"  (e.g., pulmonary embolus, asthma, emphysema)     asthma 8. CAUSE: "What do you think is causing the breathing problem?"      asthma 9. OTHER SYMPTOMS: "Do you have any other symptoms? (e.g., dizziness,  runny nose, cough, chest pain, fever)     no 10. O2 SATURATION MONITOR:  "Do you use an oxygen saturation monitor (pulse oximeter) at home?" If Yes, "What is your reading (oxygen level) today?" "What is your usual oxygen saturation reading?" (e.g., 95%)       11. PREGNANCY: "Is there any chance you are pregnant?" "When was your last menstrual period?"       na12. TRAVEL: "Have you traveled out of the country in the last month?" (e.g., travel history, exposures)       no  Protocols used: Breathing Difficulty-A-AH

## 2021-05-10 NOTE — Telephone Encounter (Signed)
Pt was called and scheduled !

## 2021-05-14 ENCOUNTER — Ambulatory Visit (INDEPENDENT_AMBULATORY_CARE_PROVIDER_SITE_OTHER): Payer: BLUE CROSS/BLUE SHIELD | Admitting: Nurse Practitioner

## 2021-05-14 ENCOUNTER — Encounter: Payer: Self-pay | Admitting: Nurse Practitioner

## 2021-05-14 ENCOUNTER — Other Ambulatory Visit: Payer: Self-pay

## 2021-05-14 VITALS — BP 93/57 | HR 59 | Temp 98.0°F | Wt 201.2 lb

## 2021-05-14 DIAGNOSIS — J4540 Moderate persistent asthma, uncomplicated: Secondary | ICD-10-CM | POA: Diagnosis not present

## 2021-05-14 DIAGNOSIS — Z7689 Persons encountering health services in other specified circumstances: Secondary | ICD-10-CM | POA: Diagnosis not present

## 2021-05-14 MED ORDER — ALBUTEROL SULFATE (2.5 MG/3ML) 0.083% IN NEBU: 2.5000 mg | INHALATION_SOLUTION | Freq: Four times a day (QID) | RESPIRATORY_TRACT | 12 refills | Status: DC | PRN

## 2021-05-14 MED ORDER — FLUTICASONE PROPIONATE HFA 110 MCG/ACT IN AERO: 1.0000 | INHALATION_SPRAY | Freq: Two times a day (BID) | RESPIRATORY_TRACT | 1 refills | Status: DC

## 2021-05-14 NOTE — Assessment & Plan Note (Signed)
Chronic, not controlled. Spirometry today showed mild obstruction with improvement from 67%-75% with albuterol nebulizer. He was prescribed flovent at urgent care, but never picked it up. Will re-send him in flovent prescription to start twice a day. Continue albuterol inhaler/nebulizer as needed. Nebulizer prescription given. Start claritin or zyrtec daily to help with allergies and trigger. Follow-up in 4 weeks or sooner with any concerns.

## 2021-05-14 NOTE — Patient Instructions (Addendum)
Start loratidine 10mg  (claritin) or cetrizine (zyrtec) every day Start flovent 1 puff twice a day Delsym as needed for cough  Albuterol inhaler or nebulizer every 6 hours as needed for shortness of breath or wheezing Look over the information for the pneumonia vaccine

## 2021-05-14 NOTE — Progress Notes (Signed)
New Patient Office Visit  Subjective:  Patient ID: Mathew Fitzpatrick, male    DOB: Aug 17, 1991  Age: 30 y.o. MRN: [id number]  CC:  Chief Complaint  Patient presents with   Establish Care    Patient is here to establish care. Patient states he is here to have a primary care provider to management his Asthma medication.    Asthma    HPI Mathew Fitzpatrick presents for new patient visit to establish care.  Introduced to Publishing rights manager role and practice setting.  All questions answered.  Discussed provider/patient relationship and expectations.  ASTHMA  Had asthma as kid, it went away, and now back for the past few months. He went to urgent care in June for asthma. He has been using albuterol inhaler 3-4 times a day and then was using his sister's advair inhaler for the last 2 weeks.   Asthma status: uncontrolled Satisfied with current treatment?: no Albuterol/rescue inhaler frequency: 2-3 times a day Dyspnea frequency: 2-3 times a day Wheezing frequency: 2-3 times a day Cough frequency: no Nocturnal symptom frequency: not recently, however has once a night  Limitation of activity: no Current upper respiratory symptoms: no Triggers: environment, heat Last Spirometry: never, ordered today Failed/intolerant to following asthma meds: n/a Asthma meds in past: albuterol Aerochamber/spacer use: no Visits to ER or Urgent Care in past year: yes Pneumovax: Not up to Date Influenza: Not up to Date   Past Medical History:  Diagnosis Date   Asthma    Bronchitis     History reviewed. No pertinent surgical history.  History reviewed. No pertinent family history.  Social History   Socioeconomic History   Marital status: Single    Spouse name: Not on file   Number of children: Not on file   Years of education: Not on file   Highest education level: Not on file  Occupational History   Not on file  Tobacco Use   Smoking status: Former    Packs/day: 0.10    Types:  Cigarettes, Cigars    Quit date: 11/09/2015    Years since quitting: 5.5   Smokeless tobacco: Never  Substance and Sexual Activity   Alcohol use: Yes    Comment: daily   Drug use: Not Currently   Sexual activity: Yes    Birth control/protection: None  Other Topics Concern   Not on file  Social History Narrative   Not on file   Social Determinants of Health   Financial Resource Strain: Not on file  Food Insecurity: Not on file  Transportation Needs: Not on file  Physical Activity: Not on file  Stress: Not on file  Social Connections: Not on file  Intimate Partner Violence: Not At Risk   Fear of Current or Ex-Partner: No   Emotionally Abused: No   Physically Abused: No   Sexually Abused: No    ROS Review of Systems  Constitutional:  Positive for fatigue.  HENT:  Positive for congestion and postnasal drip (sometimes). Negative for sinus pressure and sore throat.   Eyes: Negative.   Respiratory:  Positive for cough (intermittent), shortness of breath (intermittent) and wheezing (intermittent).   Gastrointestinal: Negative.   Genitourinary: Negative.   Musculoskeletal: Negative.   Skin: Negative.   Allergic/Immunologic: Positive for environmental allergies.  Neurological: Negative.   Psychiatric/Behavioral: Negative.     Objective:   Today's Vitals: BP (!) 93/57   Pulse (!) 59   Temp 98 F (36.7 C) (Oral)   Wt 201 lb 3.2  oz (91.3 kg)   SpO2 94%   BMI 28.06 kg/m   Physical Exam Vitals and nursing note reviewed.  Constitutional:      Appearance: Normal appearance.  HENT:     Head: Normocephalic and atraumatic.     Right Ear: Tympanic membrane, ear canal and external ear normal.     Left Ear: Tympanic membrane, ear canal and external ear normal.     Nose: Nose normal.     Mouth/Throat:     Mouth: Mucous membranes are moist.     Pharynx: Oropharynx is clear.  Eyes:     Conjunctiva/sclera: Conjunctivae normal.  Cardiovascular:     Rate and Rhythm: Normal rate  and regular rhythm.     Pulses: Normal pulses.     Heart sounds: Normal heart sounds.  Pulmonary:     Effort: Pulmonary effort is normal.     Breath sounds: Normal breath sounds.  Abdominal:     General: Bowel sounds are normal.     Palpations: Abdomen is soft.     Tenderness: There is no abdominal tenderness.  Musculoskeletal:        General: Normal range of motion.     Cervical back: Normal range of motion and neck supple.  Skin:    General: Skin is warm and dry.  Neurological:     General: No focal deficit present.     Mental Status: He is alert and oriented to person, place, and time.     Gait: Gait normal.  Psychiatric:        Mood and Affect: Mood normal.        Behavior: Behavior normal.        Thought Content: Thought content normal.        Judgment: Judgment normal.    Assessment & Plan:   Problem List Items Addressed This Visit       Respiratory   Moderate persistent asthma without complication - Primary    Chronic, not controlled. Spirometry today showed mild obstruction with improvement from 67%-75% with albuterol nebulizer. He was prescribed flovent at urgent care, but never picked it up. Will re-send him in flovent prescription to start twice a day. Continue albuterol inhaler/nebulizer as needed. Nebulizer prescription given. Start claritin or zyrtec daily to help with allergies and trigger. Follow-up in 4 weeks or sooner with any concerns.        Relevant Medications   fluticasone (FLOVENT HFA) 110 MCG/ACT inhaler   albuterol (PROVENTIL) (2.5 MG/3ML) 0.083% nebulizer solution   Other Relevant Orders   Spirometry with graph (Completed)   Spirometry with graph (Completed)   Other Visit Diagnoses     Encounter to establish care           Outpatient Encounter Medications as of 05/14/2021  Medication Sig   albuterol (VENTOLIN HFA) 108 (90 Base) MCG/ACT inhaler Inhale 2 puffs into the lungs every 6 (six) hours as needed for wheezing or shortness of breath.    albuterol (PROVENTIL) (2.5 MG/3ML) 0.083% nebulizer solution Take 3 mLs (2.5 mg total) by nebulization every 6 (six) hours as needed for wheezing or shortness of breath.   fluticasone (FLOVENT HFA) 110 MCG/ACT inhaler Inhale 1 puff into the lungs in the morning and at bedtime.   [DISCONTINUED] albuterol (PROVENTIL) (2.5 MG/3ML) 0.083% nebulizer solution Take 3 mLs (2.5 mg total) by nebulization every 6 (six) hours as needed for wheezing or shortness of breath. (Patient not taking: Reported on 05/14/2021)   [DISCONTINUED] albuterol (VENTOLIN HFA) 108 (  90 Base) MCG/ACT inhaler Inhale 2 puffs into the lungs every 4 (four) hours as needed for wheezing or shortness of breath. (Patient not taking: Reported on 05/14/2021)   [DISCONTINUED] fluticasone (FLOVENT HFA) 110 MCG/ACT inhaler Inhale 1 puff into the lungs in the morning and at bedtime. (Patient not taking: Reported on 05/14/2021)   [DISCONTINUED] methylPREDNISolone (MEDROL DOSEPAK) 4 MG TBPK tablet Take Tapered dose as directed (Patient not taking: Reported on 05/14/2021)   No facility-administered encounter medications on file as of 05/14/2021.    Follow-up: Return in about 4 weeks (around 06/11/2021) for physical .   Gerre Scull, NP

## 2021-06-04 IMAGING — CR DG CHEST 2V
1 series · 2 of 2 positions shown · non-contrast
Comparison: 07/22/2019

CLINICAL DATA: Chest pain, tightness, cough, history of asthma

EXAM:
CHEST - 2 VIEW

[Series 1: dg chest 2 view · 0.14mm/px · 2 of 2 slices shown]
[im 1/2]
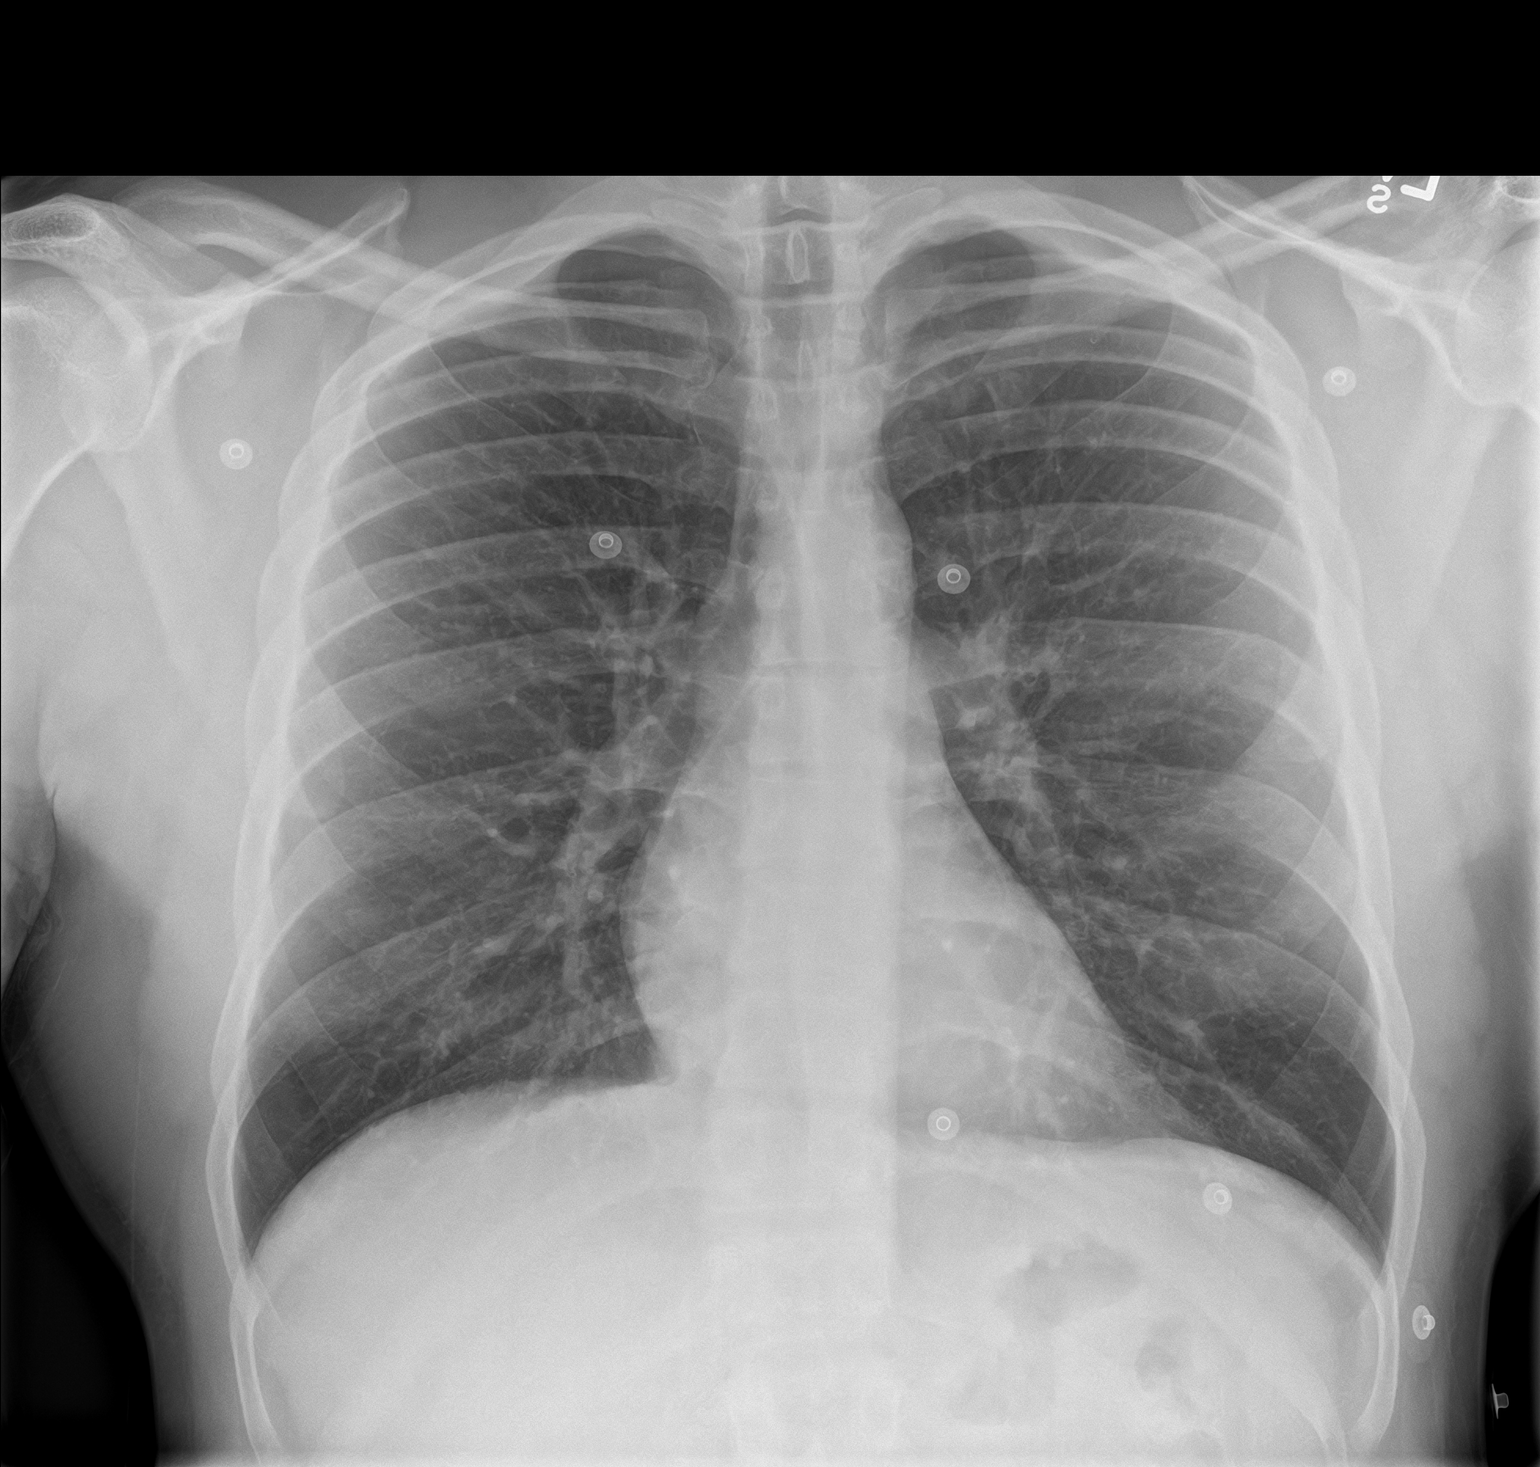
[im 2/2]
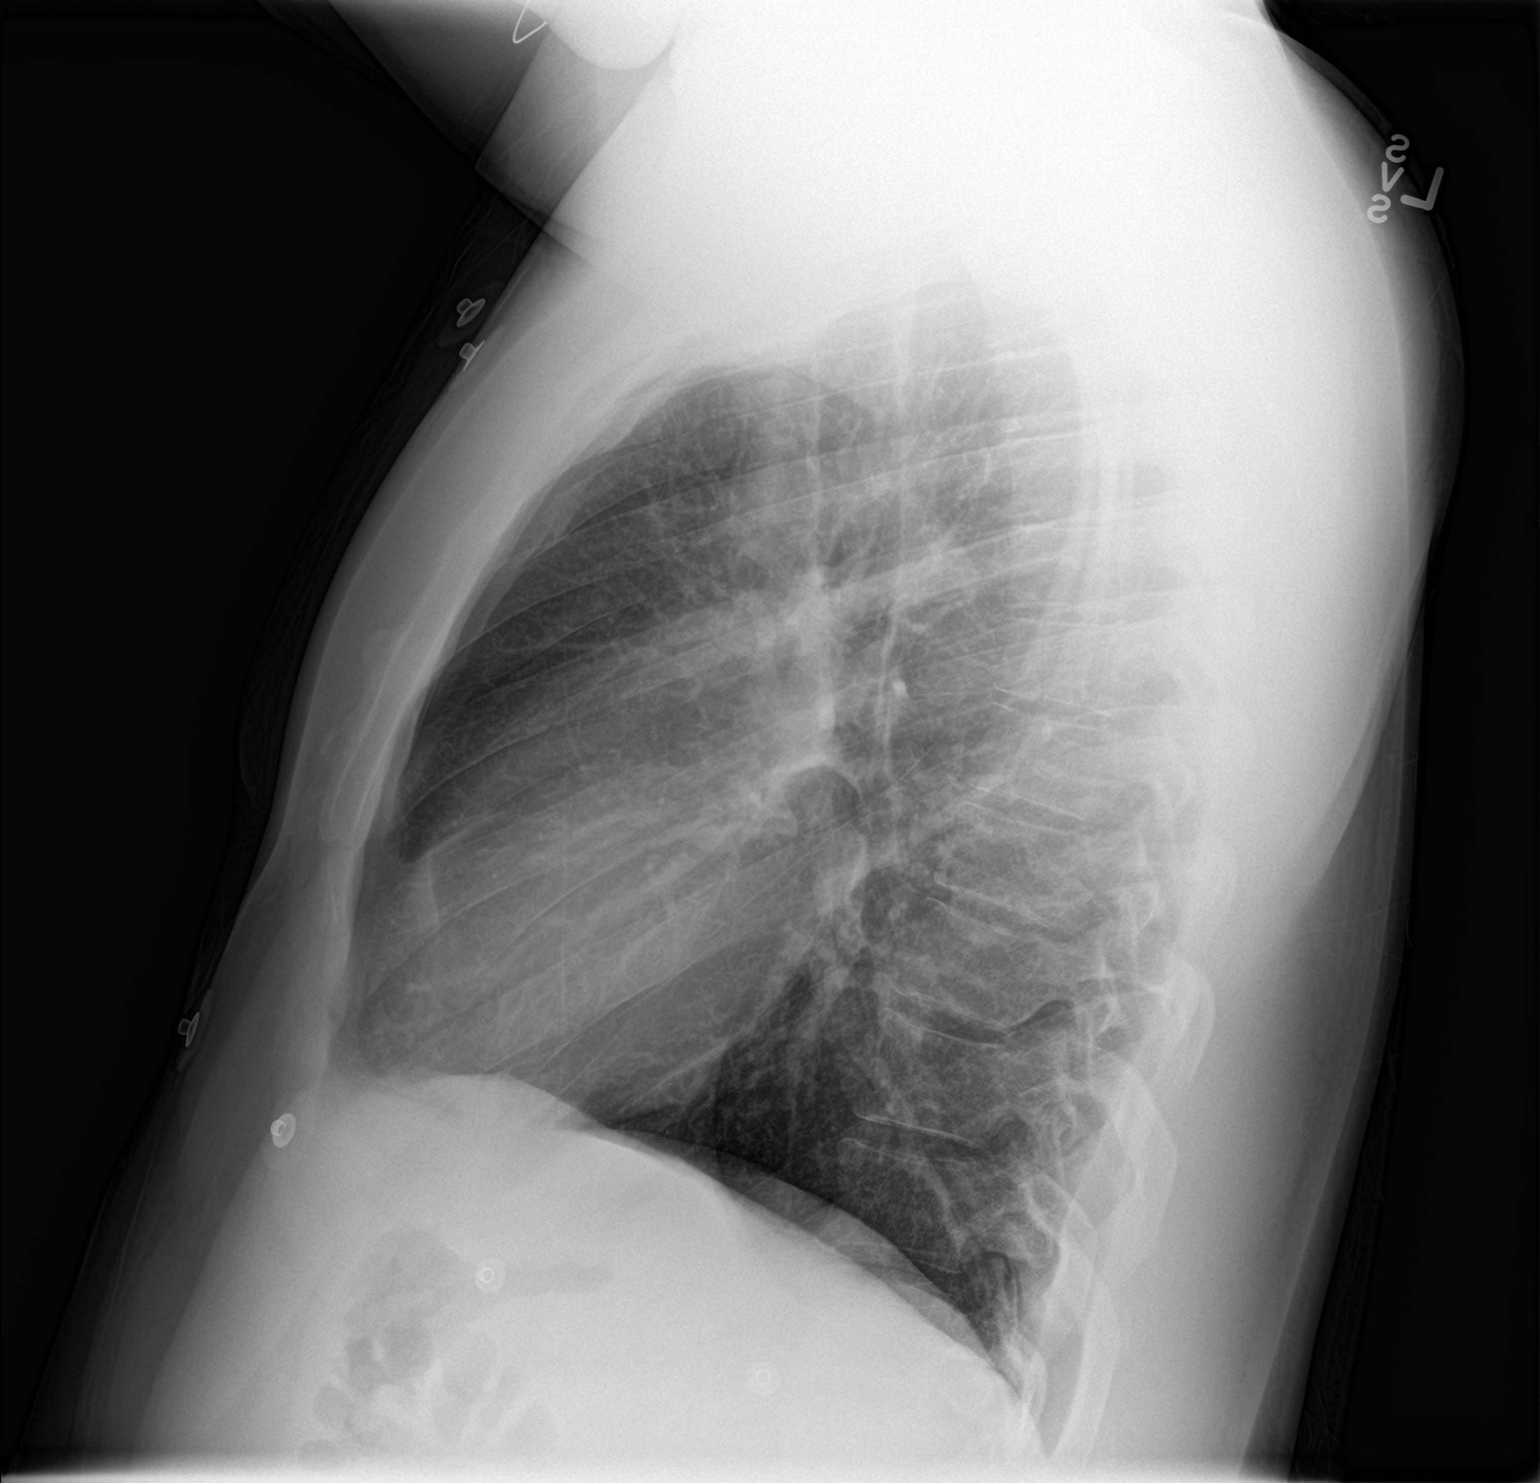

[2 of 2 positions shown; findings below may reference images not displayed]

FINDINGS: The heart size and mediastinal contours are within normal limits.
Both lungs are clear. The visualized skeletal structures are
unremarkable.
IMPRESSION: No active cardiopulmonary disease.

## 2021-06-12 ENCOUNTER — Telehealth: Payer: Self-pay

## 2021-06-12 NOTE — Telephone Encounter (Signed)
Called pt to reschedule appt, appt scheduled.

## 2021-06-15 ENCOUNTER — Encounter: Payer: BLUE CROSS/BLUE SHIELD | Admitting: Nurse Practitioner

## 2021-06-25 NOTE — Progress Notes (Signed)
BP 106/71 (BP Location: Right Arm, Patient Position: Sitting)   Pulse 77   Temp 98.5 F (36.9 C) (Oral)   Resp (!) 95   Ht 5\' 9"  (1.753 m)   Wt 203 lb (92.1 kg)   SpO2 95%   BMI 29.98 kg/m    Subjective:    Patient ID: Mathew Fitzpatrick, male    DOB: 12/19/1990, 30 y.o.   MRN: [id number]  HPI: Mathew Fitzpatrick is a 30 y.o. male presenting on 06/26/2021 for comprehensive medical examination. Current medical complaints include:none  He currently lives with: alone Interim Problems from his last visit: no  He was recently diagnosed with asthma. He went to pick up his flovent inhaler but it was over $200 and he couldn't afford it. He was able to get an advair diskus inhaler (not prescribed) and said that this was significantly helping his symptoms. He has been able to go 4 days in a row without needing albuterol inhaler. He denies shortness of breath and wheezing.   Depression Screen done today and results listed below:  Depression screen [place] 2/9 06/26/2021 05/14/2021  Decreased Interest 0 0  Down, Depressed, Hopeless 0 0  PHQ - 2 Score 0 0    The patient does not have a history of falls. I did not complete a risk assessment for falls. A plan of care for falls was not documented.   Past Medical History:  Past Medical History:  Diagnosis Date   Asthma    Bronchitis     Surgical History:  History reviewed. No pertinent surgical history.  Medications:  Current Outpatient Medications on File Prior to Visit  Medication Sig   albuterol (PROVENTIL) (2.5 MG/3ML) 0.083% nebulizer solution Take 3 mLs (2.5 mg total) by nebulization every 6 (six) hours as needed for wheezing or shortness of breath.   albuterol (VENTOLIN HFA) 108 (90 Base) MCG/ACT inhaler Inhale 2 puffs into the lungs every 6 (six) hours as needed for wheezing or shortness of breath.   No current facility-administered medications on file prior to visit.    Allergies:  Allergies  Allergen Reactions   Shellfish  Allergy Anaphylaxis    Social History:  Social History   Socioeconomic History   Marital status: Single    Spouse name: Not on file   Number of children: Not on file   Years of education: Not on file   Highest education level: Not on file  Occupational History   Not on file  Tobacco Use   Smoking status: Former    Packs/day: 0.10    Types: Cigarettes, Cigars    Quit date: 11/09/2015    Years since quitting: 5.6   Smokeless tobacco: Never  Substance and Sexual Activity   Alcohol use: Yes    Comment: daily   Drug use: Not Currently   Sexual activity: Yes    Birth control/protection: None  Other Topics Concern   Not on file  Social History Narrative   Not on file   Social Determinants of Health   Financial Resource Strain: Not on file  Food Insecurity: Not on file  Transportation Needs: Not on file  Physical Activity: Not on file  Stress: Not on file  Social Connections: Not on file  Intimate Partner Violence: Not At Risk   Fear of Current or Ex-Partner: No   Emotionally Abused: No   Physically Abused: No   Sexually Abused: No   Social History   Tobacco Use  Smoking Status Former  Packs/day: 0.10   Types: Cigarettes, Cigars   Quit date: 11/09/2015   Years since quitting: 5.6  Smokeless Tobacco Never   Social History   Substance and Sexual Activity  Alcohol Use Yes   Comment: daily    Family History:  History reviewed. No pertinent family history.  Past medical history, surgical history, medications, allergies, family history and social history reviewed with patient today and changes made to appropriate areas of the chart.   Review of Systems  Constitutional: Negative.   HENT: Negative.    Eyes: Negative.   Respiratory: Negative.    Cardiovascular: Negative.   Gastrointestinal: Negative.   Genitourinary: Negative.   Musculoskeletal: Negative.   Skin: Negative.   Neurological: Negative.   Psychiatric/Behavioral: Negative.    All other ROS  negative except what is listed above and in the HPI.      Objective:    BP 106/71 (BP Location: Right Arm, Patient Position: Sitting)   Pulse 77   Temp 98.5 F (36.9 C) (Oral)   Resp (!) 95   Ht 5\' 9"  (1.753 m)   Wt 203 lb (92.1 kg)   SpO2 95%   BMI 29.98 kg/m   Wt Readings from Last 3 Encounters:  06/26/21 203 lb (92.1 kg)  05/14/21 201 lb 3.2 oz (91.3 kg)  03/08/21 205 lb (93 kg)    Physical Exam Vitals and nursing note reviewed.  Constitutional:      Appearance: Normal appearance.  HENT:     Head: Normocephalic and atraumatic.     Nose: Nose normal.     Mouth/Throat:     Mouth: Mucous membranes are moist.     Pharynx: Oropharynx is clear.  Eyes:     Conjunctiva/sclera: Conjunctivae normal.  Cardiovascular:     Rate and Rhythm: Normal rate and regular rhythm.     Pulses: Normal pulses.     Heart sounds: Normal heart sounds.  Pulmonary:     Effort: Pulmonary effort is normal.     Breath sounds: Normal breath sounds.  Abdominal:     General: Bowel sounds are normal.     Palpations: Abdomen is soft.     Tenderness: There is no abdominal tenderness.  Musculoskeletal:        General: Normal range of motion.     Cervical back: Normal range of motion and neck supple.     Comments: Strength 5/5 in bilateral upper and lower extremities   Lymphadenopathy:     Cervical: No cervical adenopathy.  Skin:    General: Skin is warm and dry.  Neurological:     General: No focal deficit present.     Mental Status: He is alert and oriented to person, place, and time.     Cranial Nerves: No cranial nerve deficit.     Gait: Gait normal.     Deep Tendon Reflexes: Reflexes normal.  Psychiatric:        Mood and Affect: Mood normal.        Behavior: Behavior normal.        Thought Content: Thought content normal.        Judgment: Judgment normal.    Results for orders placed or performed during the hospital encounter of 03/08/21  Basic metabolic panel  Result Value Ref Range    Sodium 139 135 - 145 mmol/L   Potassium 4.3 3.5 - 5.1 mmol/L   Chloride 103 98 - 111 mmol/L   CO2 27 22 - 32 mmol/L   Glucose, Bld  100 (H) 70 - 99 mg/dL   BUN 13 6 - 20 mg/dL   Creatinine, Ser 7.49 0.61 - 1.24 mg/dL   Calcium 9.3 8.9 - 44.9 mg/dL   GFR, Estimated >67 >59 mL/min   Anion gap 9 5 - 15  CBC  Result Value Ref Range   WBC 3.1 (L) 4.0 - 10.5 K/uL   RBC 4.92 4.22 - 5.81 MIL/uL   Hemoglobin 15.7 13.0 - 17.0 g/dL   HCT 16.3 84.6 - 65.9 %   MCV 91.9 80.0 - 100.0 fL   MCH 31.9 26.0 - 34.0 pg   MCHC 34.7 30.0 - 36.0 g/dL   RDW 93.5 70.1 - 77.9 %   Platelets 265 150 - 400 K/uL   nRBC 0.0 0.0 - 0.2 %  Troponin I (High Sensitivity)  Result Value Ref Range   Troponin I (High Sensitivity) 3 <18 ng/L  Troponin I (High Sensitivity)  Result Value Ref Range   Troponin I (High Sensitivity) <2 <18 ng/L      Assessment & Plan:   Problem List Items Addressed This Visit       Respiratory   Moderate persistent asthma without complication    He was unable to afford flovent inhaler. Will send in prescription for advair diskus as this has helped his symptoms recently. Discussed that if he cannot afford this medication to call our office and we will place CCM consult to pharmacy for options. Follow up in 6 months.       Relevant Medications   fluticasone-salmeterol (ADVAIR DISKUS) 250-50 MCG/ACT AEPB   Other Visit Diagnoses     Routine general medical examination at a health care facility    -  Primary   Health maintenance reviewed and updated. He delcines tetanus, flu and pneumonia vaccine today.   Relevant Orders   CBC with Differential/Platelet   Comprehensive metabolic panel   TSH   Encounter for hepatitis C screening test for low risk patient       Will screen for hepatitis C today   Relevant Orders   Hepatitis C antibody   Encounter for lipid screening for cardiovascular disease       Will check baseline lipid panel today   Relevant Orders   Lipid Panel w/o  Chol/HDL Ratio   Screening examination for STD (sexually transmitted disease)       STD screening panel ordered today.    Relevant Orders   HIV Antibody (routine testing w rflx)   RPR   HSV(herpes simplex vrs) 1+2 ab-IgG   Acute Viral Hepatitis (HAV, HBV, HCV)   Chlamydia/GC NAA, Confirmation        Discussed aspirin prophylaxis for myocardial infarction prevention and decision was it was not indicated  LABORATORY TESTING:  Health maintenance labs ordered today as discussed above.   The natural history of prostate cancer and ongoing controversy regarding screening and potential treatment outcomes of prostate cancer has been discussed with the patient. The meaning of a false positive PSA and a false negative PSA has been discussed. He indicates understanding of the limitations of this screening test and wishes not to proceed with screening PSA testing (not indicated).   IMMUNIZATIONS:   - Tdap: Tetanus vaccination status reviewed: tetanus status unknown to the patient. He declines vaccination today - Influenza: Refused - Pneumovax: Refused - Prevnar: Not applicable - HPV: Not applicable - Zostavax vaccine: Not applicable  SCREENING: - Colonoscopy: Not applicable  Discussed with patient purpose of the colonoscopy is to detect colon  cancer at curable precancerous or early stages   - AAA Screening: Not applicable  -Hearing Test: Not applicable  -Spirometry: Not applicable   PATIENT COUNSELING:    Sexuality: Discussed sexually transmitted diseases, partner selection, use of condoms, avoidance of unintended pregnancy  and contraceptive alternatives.   Advised to avoid cigarette smoking.  I discussed with the patient that most people either abstain from alcohol or drink within safe limits (<=14/week and <=4 drinks/occasion for males, <=7/weeks and <= 3 drinks/occasion for females) and that the risk for alcohol disorders and other health effects rises proportionally with the number  of drinks per week and how often a drinker exceeds daily limits.  Discussed cessation/primary prevention of drug use and availability of treatment for abuse.   Diet: Encouraged to adjust caloric intake to maintain  or achieve ideal body weight, to reduce intake of dietary saturated fat and total fat, to limit sodium intake by avoiding high sodium foods and not adding table salt, and to maintain adequate dietary potassium and calcium preferably from fresh fruits, vegetables, and low-fat dairy products.    stressed the importance of regular exercise  Injury prevention: Discussed safety belts, safety helmets, smoke detector, smoking near bedding or upholstery.   Dental health: Discussed importance of regular tooth brushing, flossing, and dental visits.   Follow up plan: NEXT PREVENTATIVE PHYSICAL DUE IN 1 YEAR. Return in about 6 months (around 12/24/2021) for asthma.

## 2021-06-26 ENCOUNTER — Encounter: Payer: Self-pay | Admitting: Nurse Practitioner

## 2021-06-26 ENCOUNTER — Ambulatory Visit (INDEPENDENT_AMBULATORY_CARE_PROVIDER_SITE_OTHER): Payer: BLUE CROSS/BLUE SHIELD | Admitting: Nurse Practitioner

## 2021-06-26 ENCOUNTER — Other Ambulatory Visit: Payer: Self-pay

## 2021-06-26 VITALS — BP 106/71 | HR 77 | Temp 98.5°F | Resp 95 | Ht 69.0 in | Wt 203.0 lb

## 2021-06-26 DIAGNOSIS — Z0000 Encounter for general adult medical examination without abnormal findings: Secondary | ICD-10-CM | POA: Diagnosis not present

## 2021-06-26 DIAGNOSIS — Z113 Encounter for screening for infections with a predominantly sexual mode of transmission: Secondary | ICD-10-CM | POA: Diagnosis not present

## 2021-06-26 DIAGNOSIS — Z1159 Encounter for screening for other viral diseases: Secondary | ICD-10-CM | POA: Diagnosis not present

## 2021-06-26 DIAGNOSIS — Z13220 Encounter for screening for lipoid disorders: Secondary | ICD-10-CM | POA: Diagnosis not present

## 2021-06-26 DIAGNOSIS — D72819 Decreased white blood cell count, unspecified: Secondary | ICD-10-CM

## 2021-06-26 DIAGNOSIS — J4540 Moderate persistent asthma, uncomplicated: Secondary | ICD-10-CM

## 2021-06-26 DIAGNOSIS — Z136 Encounter for screening for cardiovascular disorders: Secondary | ICD-10-CM

## 2021-06-26 MED ORDER — FLUTICASONE-SALMETEROL 250-50 MCG/ACT IN AEPB: 1.0000 | INHALATION_SPRAY | Freq: Two times a day (BID) | RESPIRATORY_TRACT | 3 refills | Status: DC

## 2021-06-26 NOTE — Assessment & Plan Note (Signed)
He was unable to afford flovent inhaler. Will send in prescription for advair diskus as this has helped his symptoms recently. Discussed that if he cannot afford this medication to call our office and we will place CCM consult to pharmacy for options. Follow up in 6 months.

## 2021-06-27 LAB — CBC WITH DIFFERENTIAL/PLATELET
Basophils Absolute: 0 10*3/uL (ref 0.0–0.2)
Basos: 1 %
EOS (ABSOLUTE): 0.2 10*3/uL (ref 0.0–0.4)
Eos: 6 %
Hematocrit: 45.6 % (ref 37.5–51.0)
Hemoglobin: 15.5 g/dL (ref 13.0–17.7)
Immature Grans (Abs): 0 10*3/uL (ref 0.0–0.1)
Immature Granulocytes: 0 %
Lymphocytes Absolute: 1.2 10*3/uL (ref 0.7–3.1)
Lymphs: 42 %
MCH: 31.1 pg (ref 26.6–33.0)
MCHC: 34 g/dL (ref 31.5–35.7)
MCV: 92 fL (ref 79–97)
Monocytes Absolute: 0.3 10*3/uL (ref 0.1–0.9)
Monocytes: 11 %
Neutrophils Absolute: 1.2 10*3/uL — ABNORMAL LOW (ref 1.4–7.0)
Neutrophils: 40 %
Platelets: 256 10*3/uL (ref 150–450)
RBC: 4.98 x10E6/uL (ref 4.14–5.80)
RDW: 13.1 % (ref 11.6–15.4)
WBC: 2.9 10*3/uL — ABNORMAL LOW (ref 3.4–10.8)

## 2021-06-27 LAB — LIPID PANEL W/O CHOL/HDL RATIO
Cholesterol, Total: 226 mg/dL — ABNORMAL HIGH (ref 100–199)
HDL: 50 mg/dL (ref >39)
LDL Chol Calc (NIH): 130 mg/dL — ABNORMAL HIGH (ref 0–99)
Triglycerides: 261 mg/dL — ABNORMAL HIGH (ref 0–149)
VLDL Cholesterol Cal: 46 mg/dL — ABNORMAL HIGH (ref 5–40)

## 2021-06-27 LAB — TSH: TSH: 1.08 u[IU]/mL (ref 0.450–4.500)

## 2021-06-27 LAB — HCV INTERPRETATION

## 2021-06-27 LAB — COMPREHENSIVE METABOLIC PANEL
ALT: 51 IU/L — ABNORMAL HIGH (ref 0–44)
AST: 46 IU/L — ABNORMAL HIGH (ref 0–40)
Albumin/Globulin Ratio: 2 (ref 1.2–2.2)
Albumin: 4.6 g/dL (ref 4.1–5.2)
Alkaline Phosphatase: 89 IU/L (ref 44–121)
BUN/Creatinine Ratio: 16 (ref 9–20)
BUN: 17 mg/dL (ref 6–20)
Bilirubin Total: 0.4 mg/dL (ref 0.0–1.2)
CO2: 24 mmol/L (ref 20–29)
Calcium: 9.8 mg/dL (ref 8.7–10.2)
Chloride: 102 mmol/L (ref 96–106)
Creatinine, Ser: 1.04 mg/dL (ref 0.76–1.27)
Globulin, Total: 2.3 g/dL (ref 1.5–4.5)
Glucose: 54 mg/dL — ABNORMAL LOW (ref 65–99)
Potassium: 4.4 mmol/L (ref 3.5–5.2)
Sodium: 144 mmol/L (ref 134–144)
Total Protein: 6.9 g/dL (ref 6.0–8.5)
eGFR: 100 mL/min/{1.73_m2} (ref >59)

## 2021-06-27 LAB — RPR: RPR Ser Ql: NONREACTIVE

## 2021-06-27 LAB — ACUTE VIRAL HEPATITIS (HAV, HBV, HCV)
HCV Ab: <0.1 s/co ratio (ref 0.0–0.9)
Hep A IgM: NEGATIVE
Hep B C IgM: NEGATIVE
Hepatitis B Surface Ag: NEGATIVE

## 2021-06-27 LAB — HSV(HERPES SIMPLEX VRS) I + II AB-IGG
HSV 1 Glycoprotein G Ab, IgG: 38.3 index — ABNORMAL HIGH (ref 0.00–0.90)
HSV 2 IgG, Type Spec: <0.91 index (ref 0.00–0.90)

## 2021-06-27 LAB — HEPATITIS C ANTIBODY: Hep C Virus Ab: <0.1 s/co ratio (ref 0.0–0.9)

## 2021-06-27 LAB — HIV ANTIBODY (ROUTINE TESTING W REFLEX): HIV Screen 4th Generation wRfx: NONREACTIVE

## 2021-06-27 NOTE — Addendum Note (Signed)
Addended by: Rodman Pickle A on: 06/27/2021 05:42 PM   Modules accepted: Orders

## 2021-06-28 ENCOUNTER — Other Ambulatory Visit: Payer: Self-pay | Admitting: *Deleted

## 2021-06-28 ENCOUNTER — Telehealth: Payer: Self-pay

## 2021-06-28 NOTE — Telephone Encounter (Signed)
Requested medication (s) are due for refill today:yes  Requested medication (s) are on the active medication list: yes  Last refill: 07/22/19  18g  3 refills  Future visit scheduled yes  Notes to clinic:Historical provider. Pt saw Laureen 06/26/21. Requesting refill today please. Going out of town tomorrow.  Requested Prescriptions  Pending Prescriptions Disp Refills   albuterol (VENTOLIN HFA) 108 (90 Base) MCG/ACT inhaler 18 g 3    Sig: Inhale 2 puffs into the lungs every 6 (six) hours as needed for wheezing or shortness of breath.     Pulmonology:  Beta Agonists Failed - 06/28/2021  2:15 PM      Failed - One inhaler should last at least one month. If the patient is requesting refills earlier, contact the patient to check for uncontrolled symptoms.      Passed - Valid encounter within last 12 months    Recent Outpatient Visits           2 days ago Routine general medical examination at a health care facility   [facility], Lauren A, NP   1 month ago Moderate persistent asthma without complication   [facility], Jake Church, NP       Future Appointments             In 5 months Vigg, Avanti, MD [facility], [facility]

## 2021-06-28 NOTE — Telephone Encounter (Signed)
Copied from CRM [id number]. Topic: General - Other >> Jun 28, 2021  9:13 AM Jaquita Rector A wrote: Reason for CRM: Patient called in to inform Rodman Pickle that Rx sent to the pharmacy on 05/14/21 for albuterol (PROVENTIL) (2.5 MG/3ML) 0.083% nebulizer solution was not received and he is completely out need this Rx sent in today please. And call patient to inform at Ph#  [phone]  Contacted [facility] and confirmed that RX was received. Pharmacy is getting a refill ready for the patient now.   Called and LVM with patient letting him know the above information.

## 2021-06-29 ENCOUNTER — Telehealth: Payer: Self-pay | Admitting: Nurse Practitioner

## 2021-06-29 ENCOUNTER — Other Ambulatory Visit: Payer: Self-pay | Admitting: Nurse Practitioner

## 2021-06-29 LAB — CHLAMYDIA/GC NAA, CONFIRMATION
Chlamydia trachomatis, NAA: NEGATIVE
Neisseria gonorrhoeae, NAA: NEGATIVE

## 2021-06-29 MED ORDER — ALBUTEROL SULFATE HFA 108 (90 BASE) MCG/ACT IN AERS: 2.0000 | INHALATION_SPRAY | Freq: Four times a day (QID) | RESPIRATORY_TRACT | 0 refills | Status: DC | PRN

## 2021-06-29 MED ORDER — ALBUTEROL SULFATE HFA 108 (90 BASE) MCG/ACT IN AERS: 2.0000 | INHALATION_SPRAY | Freq: Four times a day (QID) | RESPIRATORY_TRACT | 3 refills | Status: DC | PRN

## 2021-06-29 NOTE — Telephone Encounter (Signed)
RX was sent in, called and let patient know.

## 2021-06-29 NOTE — Telephone Encounter (Signed)
Pt called and is requesting to have inhaler sent to new pharmacy. Please advise.    [facility] [address] [place], [id number]

## 2021-06-29 NOTE — Telephone Encounter (Signed)
Pt called in because he thought that he was supposed to get an albuterol inhaler instead of nebulizer solution. Pt states he will be leaving at 11:30 am for vacation and asked if it was possible to send it to the pharmacy of the location he will be in.

## 2021-07-21 ENCOUNTER — Other Ambulatory Visit: Payer: Self-pay | Admitting: Nurse Practitioner

## 2021-07-21 NOTE — Telephone Encounter (Signed)
Requested medications are due for refill today requesting inhaler early  Requested medications are on the active medication list yes  Last refill 06/29/21  Last visit 06/26/21  Future visit scheduled 12/24/21  Notes to clinic requesting inhaler early, please assess.  Requested Prescriptions  Pending Prescriptions Disp Refills   albuterol (VENTOLIN HFA) 108 (90 Base) MCG/ACT inhaler [Pharmacy Med Name: ALBUTEROL HFA (VENTOLIN) INH] 18 each     Sig: TAKE 2 PUFFS BY MOUTH EVERY 6 HOURS AS NEEDED FOR WHEEZE OR SHORTNESS OF BREATH     Pulmonology:  Beta Agonists Failed - 07/21/2021 12:43 AM      Failed - One inhaler should last at least one month. If the patient is requesting refills earlier, contact the patient to check for uncontrolled symptoms.      Passed - Valid encounter within last 12 months    Recent Outpatient Visits           3 weeks ago Routine general medical examination at a health care facility   [facility], Lauren A, NP   2 months ago Moderate persistent asthma without complication   [facility], Jake Church, NP       Future Appointments             In 5 months Vigg, Avanti, MD [facility], [facility]

## 2021-07-26 ENCOUNTER — Other Ambulatory Visit: Payer: Self-pay | Admitting: Nurse Practitioner

## 2021-07-26 ENCOUNTER — Other Ambulatory Visit: Payer: Self-pay

## 2021-07-26 ENCOUNTER — Other Ambulatory Visit: Payer: BLUE CROSS/BLUE SHIELD

## 2021-07-26 DIAGNOSIS — D72819 Decreased white blood cell count, unspecified: Secondary | ICD-10-CM

## 2021-07-26 DIAGNOSIS — J4540 Moderate persistent asthma, uncomplicated: Secondary | ICD-10-CM

## 2021-07-26 MED ORDER — ALBUTEROL SULFATE HFA 108 (90 BASE) MCG/ACT IN AERS: INHALATION_SPRAY | RESPIRATORY_TRACT | 1 refills | Status: DC

## 2021-07-26 NOTE — Progress Notes (Signed)
Unable to afford flovent and advair. Consult to pharmacy with CCM to see if there is a more affordable option.

## 2021-07-27 ENCOUNTER — Telehealth: Payer: Self-pay

## 2021-07-27 LAB — CBC WITH DIFFERENTIAL/PLATELET
Basophils Absolute: 0 10*3/uL (ref 0.0–0.2)
Basos: 1 %
EOS (ABSOLUTE): 0.2 10*3/uL (ref 0.0–0.4)
Eos: 5 %
Hematocrit: 45.4 % (ref 37.5–51.0)
Hemoglobin: 15.6 g/dL (ref 13.0–17.7)
Immature Grans (Abs): 0 10*3/uL (ref 0.0–0.1)
Immature Granulocytes: 0 %
Lymphocytes Absolute: 2.4 10*3/uL (ref 0.7–3.1)
Lymphs: 63 %
MCH: 31.1 pg (ref 26.6–33.0)
MCHC: 34.4 g/dL (ref 31.5–35.7)
MCV: 90 fL (ref 79–97)
Monocytes Absolute: 0.4 10*3/uL (ref 0.1–0.9)
Monocytes: 11 %
Neutrophils Absolute: 0.7 10*3/uL — ABNORMAL LOW (ref 1.4–7.0)
Neutrophils: 20 %
Platelets: 283 10*3/uL (ref 150–450)
RBC: 5.02 x10E6/uL (ref 4.14–5.80)
RDW: 13 % (ref 11.6–15.4)
WBC: 3.7 10*3/uL (ref 3.4–10.8)

## 2021-07-27 NOTE — Chronic Care Management (AMB) (Signed)
  Care Management   Outreach Note  07/27/2021 Name: Mathew Fitzpatrick MRN: [id number] DOB: Feb 20, 1991  Referred by: Gerre Scull, NP Reason for referral : Care Coordination (Outreach to schedule referral with RN CM )   An unsuccessful telephone outreach was attempted today. The patient was referred to the case management team for assistance with care management and care coordination.   Follow Up Plan:  A HIPAA compliant phone message was left for the patient providing contact information and requesting a return call.  The care management team will reach out to the patient again over the next 7 days.  If patient returns call to provider office, please advise to call Embedded Care Management Care Guide Penne Lash at [phone]  Penne Lash, RMA Care Guide, Embedded Care Coordination [facility]  [place], [place] [address] Direct Dial: [phone] [email or web address]: [email or web address]

## 2021-08-03 ENCOUNTER — Other Ambulatory Visit: Payer: Self-pay | Admitting: Nurse Practitioner

## 2021-08-03 ENCOUNTER — Telehealth: Payer: Self-pay | Admitting: Pharmacist

## 2021-08-03 MED ORDER — FLUTICASONE-SALMETEROL 113-14 MCG/ACT IN AEPB: 1.0000 | INHALATION_SPRAY | Freq: Two times a day (BID) | RESPIRATORY_TRACT | 3 refills | Status: DC

## 2021-08-03 NOTE — Progress Notes (Signed)
Will send AirDuo Respiclick to pharmacy per pharmacist recommendation based on cost.

## 2021-08-03 NOTE — Telephone Encounter (Signed)
   Care Management   Outreach Note  08/03/2021 Name: Mathew Fitzpatrick MRN: [id number] DOB: 1991-06-09  Referred by: Gerre Scull, NP Reason for referral : No chief complaint on file.  CM Pharmacist covering at [facility] today. Referral placed to Care Management team by PCP on 10/20 for assistance with cost of patient's maintenance LABA/ICS inhaler.  Outreach to patient today by telephone. Identify patient currently has a Therapist, sports plan with high prescription deductible. Reports cost of either Advair or Flovent inhalers are unaffordable to him.   Collaborate with PCP to request a switch of patient's maintenance inhaler from Advair to AirDuo RespiClick for cost savings to patient. Follow up with RPh Misty Stanley with [facility] to confirm copayment to patient: $20/month. Patient confirms this cost is affordable and will follow up with pharmacy regarding pick up on Monday.  Counsel patient on administration of AirDuo RespiClick, including importance of rinsing out his mouth after each use. Provide patient with directions for how to access "how to use" video for this device and advise him to review it.  Encourage patient to also sign up for waitlist for [employer] Asthma fund.  Follow Up Plan: Patient provided with contact information for CM Pharmacist and advised to contact clinic or CM Pharmacist with further medication questions/concerns  Estelle Grumbles, PharmD, [facility] Clinical Pharmacist [facility] Care Management [phone]

## 2021-10-17 ENCOUNTER — Telehealth: Payer: Self-pay | Admitting: Internal Medicine

## 2021-10-17 NOTE — Telephone Encounter (Signed)
Copied from [place] [id number]. Topic: General - Other >> Oct 17, 2021  9:59 AM Leward Quan A wrote: Reason for CRM: Patient called in to inquire of Dr Neomia Dear about the HPV vaccine and want to know what it is and if this is something he will need and is it beneficial to him. Also if he need it can he get it here in the office. Please call Ph# [phone]

## 2021-10-18 NOTE — Telephone Encounter (Signed)
Left message asking pt to call back to schedule an appt to discuss.

## 2021-10-23 NOTE — Telephone Encounter (Signed)
Pt states he will wait to discuss at his appt in March.

## 2021-12-24 ENCOUNTER — Ambulatory Visit: Payer: BLUE CROSS/BLUE SHIELD | Admitting: Internal Medicine

## 2021-12-28 ENCOUNTER — Ambulatory Visit: Payer: BLUE CROSS/BLUE SHIELD | Admitting: Internal Medicine

## 2022-01-30 ENCOUNTER — Encounter: Payer: Self-pay | Admitting: Internal Medicine

## 2022-01-30 ENCOUNTER — Ambulatory Visit (INDEPENDENT_AMBULATORY_CARE_PROVIDER_SITE_OTHER): Payer: BLUE CROSS/BLUE SHIELD | Admitting: Internal Medicine

## 2022-01-30 VITALS — BP 120/78 | HR 58 | Temp 98.0°F | Ht 69.02 in | Wt 192.6 lb

## 2022-01-30 DIAGNOSIS — J45909 Unspecified asthma, uncomplicated: Secondary | ICD-10-CM | POA: Diagnosis not present

## 2022-01-30 DIAGNOSIS — E7800 Pure hypercholesterolemia, unspecified: Secondary | ICD-10-CM | POA: Diagnosis not present

## 2022-01-30 LAB — URINALYSIS, ROUTINE W REFLEX MICROSCOPIC
Bilirubin, UA: NEGATIVE
Glucose, UA: NEGATIVE
Ketones, UA: NEGATIVE
Leukocytes,UA: NEGATIVE
Nitrite, UA: NEGATIVE
Protein,UA: NEGATIVE
RBC, UA: NEGATIVE
Specific Gravity, UA: 1.025 (ref 1.005–1.030)
Urobilinogen, Ur: 0.2 mg/dL (ref 0.2–1.0)
pH, UA: 7 (ref 5.0–7.5)

## 2022-01-30 MED ORDER — SPIRIVA RESPIMAT 1.25 MCG/ACT IN AERS: 2.0000 | INHALATION_SPRAY | Freq: Every day | RESPIRATORY_TRACT | 5 refills | Status: DC

## 2022-01-30 MED ORDER — TRIAMCINOLONE 0.1 % CREAM:EUCERIN CREAM 1:1: 1.0000 "application " | TOPICAL_CREAM | Freq: Two times a day (BID) | CUTANEOUS | 4 refills | Status: DC | PRN

## 2022-01-30 NOTE — Progress Notes (Signed)
? ?BP 120/78   Pulse (!) 58   Temp 98 ?F (36.7 ?C) (Oral)   Ht 5' 9.02" (1.753 m)   Wt 192 lb 9.6 oz (87.4 kg)   SpO2 99%   BMI 28.43 kg/m?   ? ?Subjective:  ? ? Patient ID: Mathew Fitzpatrick, male    DOB: 08/19/1991, 31 y.o.   MRN: [id number] ? ?Chief Complaint  ?Patient presents with  ? Asthma  ? ? ?HPI: ?Mathew Fitzpatrick is a 31 y.o. male ? ?Asthma ?He complains of cough and shortness of breath. There is no chest tightness, difficulty breathing, frequent throat clearing, hemoptysis, hoarse voice, sputum production or wheezing. Primary symptoms comments: Non compliant with advair doesn't take it bid ?Using more albuterol for some reason .Marland Kitchen This is a chronic problem. Pertinent negatives include no appetite change, chest pain, ear pain, fever, headaches or myalgias. His past medical history is significant for asthma.  ? ?Chief Complaint  ?Patient presents with  ? Asthma  ? ? ?Relevant past medical, surgical, family and social history reviewed and updated as indicated. Interim medical history since our last visit reviewed. ?Allergies and medications reviewed and updated. ? ?Review of Systems  ?Constitutional:  Negative for activity change, appetite change, chills, fatigue and fever.  ?HENT:  Negative for congestion, ear discharge, ear pain, facial swelling and hoarse voice.   ?Eyes:  Negative for pain, discharge and itching.  ?Respiratory:  Positive for cough and shortness of breath. Negative for hemoptysis, sputum production, chest tightness and wheezing.   ?Cardiovascular:  Negative for chest pain, palpitations and leg swelling.  ?Gastrointestinal:  Negative for abdominal distention, abdominal pain, blood in stool, constipation, diarrhea, nausea and vomiting.  ?Endocrine: Negative for cold intolerance, heat intolerance, polydipsia, polyphagia and polyuria.  ?Genitourinary:  Negative for difficulty urinating, dysuria, flank pain, frequency, hematuria and urgency.  ?Musculoskeletal:  Negative for arthralgias,  gait problem, joint swelling and myalgias.  ?Skin:  Positive for rash. Negative for color change and wound.  ?Neurological:  Negative for dizziness, tremors, speech difficulty, weakness, light-headedness, numbness and headaches.  ?Hematological:  Does not bruise/bleed easily.  ?Psychiatric/Behavioral:  Negative for agitation, confusion, decreased concentration, sleep disturbance and suicidal ideas.   ? ?Per HPI unless specifically indicated above ? ?   ?Objective:  ?  ?BP 120/78   Pulse (!) 58   Temp 98 ?F (36.7 ?C) (Oral)   Ht 5' 9.02" (1.753 m)   Wt 192 lb 9.6 oz (87.4 kg)   SpO2 99%   BMI 28.43 kg/m?   ?Wt Readings from Last 3 Encounters:  ?01/30/22 192 lb 9.6 oz (87.4 kg)  ?06/26/21 203 lb (92.1 kg)  ?05/14/21 201 lb 3.2 oz (91.3 kg)  ?  ?Physical Exam ?Vitals and nursing note reviewed.  ?Constitutional:   ?   General: He is not in acute distress. ?   Appearance: Normal appearance. He is not ill-appearing or diaphoretic.  ?HENT:  ?   Head: Normocephalic and atraumatic.  ?   Right Ear: Tympanic membrane and external ear normal. There is no impacted cerumen.  ?   Left Ear: External ear normal.  ?   Nose: No congestion or rhinorrhea.  ?   Mouth/Throat:  ?   Pharynx: No oropharyngeal exudate or posterior oropharyngeal erythema.  ?Eyes:  ?   Conjunctiva/sclera: Conjunctivae normal.  ?   Pupils: Pupils are equal, round, and reactive to light.  ?Cardiovascular:  ?   Rate and Rhythm: Normal rate and regular rhythm.  ?  Heart sounds: No murmur heard. ?  No friction rub. No gallop.  ?Pulmonary:  ?   Effort: No respiratory distress.  ?   Breath sounds: No stridor. No wheezing or rhonchi.  ?Chest:  ?   Chest wall: No tenderness.  ?Abdominal:  ?   General: Abdomen is flat. Bowel sounds are normal.  ?   Palpations: Abdomen is soft. There is no mass.  ?   Tenderness: There is no abdominal tenderness.  ?Musculoskeletal:  ?   Cervical back: Normal range of motion and neck supple. No rigidity or tenderness.  ?   Left lower  leg: No edema.  ?Skin: ?   General: Skin is warm and dry.  ?Neurological:  ?   Mental Status: He is alert.  ? ? ?Results for orders placed or performed in visit on 07/26/21  ?CBC with Differential/Platelet  ?Result Value Ref Range  ? WBC 3.7 3.4 - 10.8 x10E3/uL  ? RBC 5.02 4.14 - 5.80 x10E6/uL  ? Hemoglobin 15.6 13.0 - 17.7 g/dL  ? Hematocrit 45.4 37.5 - 51.0 %  ? MCV 90 79 - 97 fL  ? MCH 31.1 26.6 - 33.0 pg  ? MCHC 34.4 31.5 - 35.7 g/dL  ? RDW 13.0 11.6 - 15.4 %  ? Platelets 283 150 - 450 x10E3/uL  ? Neutrophils 20 Not Estab. %  ? Lymphs 63 Not Estab. %  ? Monocytes 11 Not Estab. %  ? Eos 5 Not Estab. %  ? Basos 1 Not Estab. %  ? Neutrophils Absolute 0.7 (L) 1.4 - 7.0 x10E3/uL  ? Lymphocytes Absolute 2.4 0.7 - 3.1 x10E3/uL  ? Monocytes Absolute 0.4 0.1 - 0.9 x10E3/uL  ? EOS (ABSOLUTE) 0.2 0.0 - 0.4 x10E3/uL  ? Basophils Absolute 0.0 0.0 - 0.2 x10E3/uL  ? Immature Granulocytes 0 Not Estab. %  ? Immature Grans (Abs) 0.0 0.0 - 0.1 x10E3/uL  ? Hematology Comments: Note:   ? ?   ? ? ?Current Outpatient Medications:  ?  albuterol (PROVENTIL) (2.5 MG/3ML) 0.083% nebulizer solution, Take 3 mLs (2.5 mg total) by nebulization every 6 (six) hours as needed for wheezing or shortness of breath., Disp: 75 mL, Rfl: 12 ?  albuterol (VENTOLIN HFA) 108 (90 Base) MCG/ACT inhaler, TAKE 2 PUFFS BY MOUTH EVERY 6 HOURS AS NEEDED FOR WHEEZE OR SHORTNESS OF BREATH, Disp: 18 each, Rfl: 1 ?  Fluticasone-Salmeterol (AIRDUO RESPICLICK 113/14) 113-14 MCG/ACT AEPB, Inhale 1 puff into the lungs in the morning and at bedtime., Disp: 1 each, Rfl: 3 ?  Tiotropium Bromide Monohydrate (SPIRIVA RESPIMAT) 1.25 MCG/ACT AERS, Inhale 2 puffs into the lungs daily., Disp: 1 each, Rfl: 5 ?  Triamcinolone Acetonide (TRIAMCINOLONE 0.1 % CREAM : EUCERIN) CREA, Apply 1 application. topically 2 (two) times daily as needed., Disp: 1 each, Rfl: 4  ? ? ?Assessment & Plan:  ?Asthma : was on advair rx sent in went a month without it. Has been using albuterol.  ?Will  refer to pulmonology. Needs PFTs .  ?Add spiriva to regimen. ?Encouraged proper usage of inhalers and educated that needs to be compliant with   maintenance inhalers. ? ?2. Eczema will need to start pt on triamcinolone bid for the spots.  ? ? ?3. Elevated Cholesterol last checked recheck ?Lab Results  ?Component Value Date  ? CHOL 226 (H) 06/26/2021  ? HDL 50 06/26/2021  ? LDLCALC 130 (H) 06/26/2021  ? TRIG 261 (H) 06/26/2021  ? ? ? ?Problem List Items Addressed This Visit   ?None ?  Visit Diagnoses   ? ? Uncomplicated asthma, unspecified asthma severity, unspecified whether persistent    -  Primary  ? Relevant Medications  ? Tiotropium Bromide Monohydrate (SPIRIVA RESPIMAT) 1.25 MCG/ACT AERS  ? Other Relevant Orders  ? Ambulatory referral to Pulmonology  ? Elevated cholesterol      ? Relevant Orders  ? Lipid panel  ? CBC with Differential/Platelet  ? Comprehensive metabolic panel  ? Urinalysis, Routine w reflex microscopic  ? TSH  ? ?  ?  ? ?Orders Placed This Encounter  ?Procedures  ? Lipid panel  ? CBC with Differential/Platelet  ? Comprehensive metabolic panel  ? Urinalysis, Routine w reflex microscopic  ? TSH  ? Ambulatory referral to Pulmonology  ?  ? ?Meds ordered this encounter  ?Medications  ? Tiotropium Bromide Monohydrate (SPIRIVA RESPIMAT) 1.25 MCG/ACT AERS  ?  Sig: Inhale 2 puffs into the lungs daily.  ?  Dispense:  1 each  ?  Refill:  5  ? Triamcinolone Acetonide (TRIAMCINOLONE 0.1 % CREAM : EUCERIN) CREA  ?  Sig: Apply 1 application. topically 2 (two) times daily as needed.  ?  Dispense:  1 each  ?  Refill:  4  ?  ? ?Follow up plan: ?Return in about 6 months (around 08/01/2022). ? ?

## 2022-01-31 ENCOUNTER — Ambulatory Visit: Payer: BLUE CROSS/BLUE SHIELD | Admitting: Internal Medicine

## 2022-01-31 LAB — CBC WITH DIFFERENTIAL/PLATELET
Basophils Absolute: 0 10*3/uL (ref 0.0–0.2)
Basos: 1 %
EOS (ABSOLUTE): 0.1 10*3/uL (ref 0.0–0.4)
Eos: 5 %
Hematocrit: 46.8 % (ref 37.5–51.0)
Hemoglobin: 15.8 g/dL (ref 13.0–17.7)
Immature Grans (Abs): 0 10*3/uL (ref 0.0–0.1)
Immature Granulocytes: 0 %
Lymphocytes Absolute: 1.6 10*3/uL (ref 0.7–3.1)
Lymphs: 55 %
MCH: 30.7 pg (ref 26.6–33.0)
MCHC: 33.8 g/dL (ref 31.5–35.7)
MCV: 91 fL (ref 79–97)
Monocytes Absolute: 0.4 10*3/uL (ref 0.1–0.9)
Monocytes: 12 %
Neutrophils Absolute: 0.8 10*3/uL — ABNORMAL LOW (ref 1.4–7.0)
Neutrophils: 27 %
Platelets: 300 10*3/uL (ref 150–450)
RBC: 5.15 x10E6/uL (ref 4.14–5.80)
RDW: 13.6 % (ref 11.6–15.4)
WBC: 2.8 10*3/uL — ABNORMAL LOW (ref 3.4–10.8)

## 2022-01-31 LAB — COMPREHENSIVE METABOLIC PANEL
ALT: 38 IU/L (ref 0–44)
AST: 49 IU/L — ABNORMAL HIGH (ref 0–40)
Albumin/Globulin Ratio: 2.2 (ref 1.2–2.2)
Albumin: 4.8 g/dL (ref 4.1–5.2)
Alkaline Phosphatase: 94 IU/L (ref 44–121)
BUN/Creatinine Ratio: 13 (ref 9–20)
BUN: 14 mg/dL (ref 6–20)
Bilirubin Total: 0.5 mg/dL (ref 0.0–1.2)
CO2: 25 mmol/L (ref 20–29)
Calcium: 10 mg/dL (ref 8.7–10.2)
Chloride: 103 mmol/L (ref 96–106)
Creatinine, Ser: 1.08 mg/dL (ref 0.76–1.27)
Globulin, Total: 2.2 g/dL (ref 1.5–4.5)
Glucose: 87 mg/dL (ref 70–99)
Potassium: 4.6 mmol/L (ref 3.5–5.2)
Sodium: 142 mmol/L (ref 134–144)
Total Protein: 7 g/dL (ref 6.0–8.5)
eGFR: 96 mL/min/{1.73_m2} (ref >59)

## 2022-01-31 LAB — LIPID PANEL
Chol/HDL Ratio: 2.8 ratio (ref 0.0–5.0)
Cholesterol, Total: 226 mg/dL — ABNORMAL HIGH (ref 100–199)
HDL: 82 mg/dL (ref >39)
LDL Chol Calc (NIH): 126 mg/dL — ABNORMAL HIGH (ref 0–99)
Triglycerides: 105 mg/dL (ref 0–149)
VLDL Cholesterol Cal: 18 mg/dL (ref 5–40)

## 2022-01-31 LAB — TSH: TSH: 1.04 u[IU]/mL (ref 0.450–4.500)

## 2022-02-23 ENCOUNTER — Other Ambulatory Visit: Payer: Self-pay | Admitting: Nurse Practitioner

## 2022-03-05 ENCOUNTER — Ambulatory Visit (INDEPENDENT_AMBULATORY_CARE_PROVIDER_SITE_OTHER): Payer: Self-pay | Admitting: Pulmonary Disease

## 2022-03-05 ENCOUNTER — Encounter: Payer: Self-pay | Admitting: Pulmonary Disease

## 2022-03-05 VITALS — BP 118/70 | HR 71 | Temp 98.7°F | Ht 71.0 in | Wt 190.0 lb

## 2022-03-05 DIAGNOSIS — R0789 Other chest pain: Secondary | ICD-10-CM

## 2022-03-05 DIAGNOSIS — J4540 Moderate persistent asthma, uncomplicated: Secondary | ICD-10-CM

## 2022-03-05 MED ORDER — AIRDUO DIGIHALER 232-14 MCG/ACT IN AEPB: 1.0000 | INHALATION_SPRAY | Freq: Two times a day (BID) | RESPIRATORY_TRACT | 11 refills | Status: DC

## 2022-03-05 MED ORDER — PREDNISONE 20 MG PO TABS: 20.0000 mg | ORAL_TABLET | Freq: Every day | ORAL | 0 refills | Status: AC

## 2022-03-05 NOTE — Patient Instructions (Signed)
Visit this website for coupon or copay card  https://www.digihaler.com/savings/  Use 1 puff twice a day, every day  Rinse mouth out after every use  If any issues with inhalers please send me a message so we can work on it  Return to clinic in 3 months or sooner as needed

## 2022-03-05 NOTE — Progress Notes (Signed)
@Patient  ID: Mathew Fitzpatrick, male    DOB: 1991/01/10, 31 y.o.   MRN: [id number]  Chief Complaint  Patient presents with   Consult    Pt is here for asthma. Pt states he has had asthma all his life. Pt is Albuterol as needed and Airduo. Pt was given Carmie EndSprivia but he states that its too expensive with his insurance.     Referring provider: Loura PardonVigg, Avanti, MD  HPI:   31 y.o. man whom we are seeing in consultation for evaluation of asthma with concern for poorly controlled symptoms.  Note from PCP reviewed.  Patient as he was out of asthma 89-year-old.  Had symptoms throughout his whole life.  Only ever used albuterol as needed.  Nebulized at the younger child.  An inhaler.  Over the last year to seem like symptoms have worsened.  He was never on the maintenance inhaler prior to the last year or 2.  He had been prescribed Advair as well as AirDuo RespiClick over the last year.  He has not had good adherence to this.  Cough seems to be the major issue.  Pharmacy note from 07/2021 reviewed where they were able to get him AirDuo RespiClick for $20 a month.  He uses for about a month or so.  Then set the pharmacy tell him he would need a prior authorization.  Sounds like he was using a coupon card is my guess.  Not sure if this was misplaced etc.  He has been off maintenance inhalers for many months.  He has cough.  He has dyspnea on exertion.  He has intermittent chest tightness that usually occurs at rest.  Feels like he cannot get a deep breath.  He is albuterol several times a day.  He gets intermittent relief with albuterol.  He does think when he was using maintenance ICS/LABA inhalers his albuterol usage was decreased.  He is not sure if he is gotten courses of prednisone in the past.  May be at ED visits.  He is unsure if they have helped or not.  Reviewed serial chest x-rays dating back to 2017 including in 2020 and most recently 03/2021 reveal clear lungs with evidence of hyperinflation on the  lateral views on my interpretation.  PMH: Asthma, seasonal allergies Surgical history: Reviewed, he denies any Family history: History of allergies and asthma in first degree relatives Social history: Current smoker, 2 cigarettes a day, lives in [place], works for [employer]   Questionaires / Pulmonary Flowsheets:   ACT:  Asthma Control Test ACT Total Score  03/05/2022 10:35 AM 10    MMRC:     View : No data to display.          Epworth:      View : No data to display.          Tests:   FENO:  No results found for: NITRICOXIDE  PFT:     View : No data to display.          WALK:      View : No data to display.          Imaging: No results found. Personally reviewed and as per EMR discussion this note Lab Results: Personally reviewed CBC    Component Value Date/Time   WBC 2.8 (L) 01/30/2022 1027   WBC 3.1 (L) 03/08/2021 0954   RBC 5.15 01/30/2022 1027   RBC 4.92 03/08/2021 0954   HGB 15.8 01/30/2022 1027   HCT 46.8  01/30/2022 1027   PLT 300 01/30/2022 1027   MCV 91 01/30/2022 1027   MCH 30.7 01/30/2022 1027   MCH 31.9 03/08/2021 0954   MCHC 33.8 01/30/2022 1027   MCHC 34.7 03/08/2021 0954   RDW 13.6 01/30/2022 1027   LYMPHSABS 1.6 01/30/2022 1027   EOSABS 0.1 01/30/2022 1027   BASOSABS 0.0 01/30/2022 1027    BMET    Component Value Date/Time   NA 142 01/30/2022 1027   K 4.6 01/30/2022 1027   CL 103 01/30/2022 1027   CO2 25 01/30/2022 1027   GLUCOSE 87 01/30/2022 1027   GLUCOSE 100 (H) 03/08/2021 0954   BUN 14 01/30/2022 1027   CREATININE 1.08 01/30/2022 1027   CALCIUM 10.0 01/30/2022 1027   GFRNONAA >60 03/08/2021 0954    BNP No results found for: BNP  ProBNP No results found for: PROBNP  Specialty Problems       Pulmonary Problems   Moderate persistent asthma without complication   Uncomplicated asthma    Allergies  Allergen Reactions   Shellfish Allergy Anaphylaxis     There is no immunization history on  file for this patient.  Past Medical History:  Diagnosis Date   Asthma    Bronchitis     Tobacco History: Social History   Tobacco Use  Smoking Status Former   Packs/day: 0.10   Types: Cigarettes, Cigars   Quit date: 11/09/2015   Years since quitting: 6.3  Smokeless Tobacco Never   Counseling given: Not Answered   Continue to not smoke  Outpatient Encounter Medications as of 03/05/2022  Medication Sig   albuterol (PROVENTIL) (2.5 MG/3ML) 0.083% nebulizer solution Take 3 mLs (2.5 mg total) by nebulization every 6 (six) hours as needed for wheezing or shortness of breath.   albuterol (VENTOLIN HFA) 108 (90 Base) MCG/ACT inhaler INHALE TWO PUFFS BY MOUTH EVERY 6 HOURS AS NEEDED FOR WHEEZE OR SHORTNESS OF BREATH   Fluticasone-Salmeterol,sensor, (AIRDUO DIGIHALER) 232-14 MCG/ACT AEPB Inhale 1 puff into the lungs in the morning and at bedtime.   predniSONE (DELTASONE) 20 MG tablet Take 1 tablet (20 mg total) by mouth daily with breakfast for 5 days.   Triamcinolone Acetonide (TRIAMCINOLONE 0.1 % CREAM : EUCERIN) CREA Apply 1 application. topically 2 (two) times daily as needed.   [DISCONTINUED] Fluticasone-Salmeterol (AIRDUO RESPICLICK 113/14) 113-14 MCG/ACT AEPB Inhale 1 puff into the lungs in the morning and at bedtime.   Tiotropium Bromide Monohydrate (SPIRIVA RESPIMAT) 1.25 MCG/ACT AERS Inhale 2 puffs into the lungs daily. (Patient not taking: Reported on 03/05/2022)   No facility-administered encounter medications on file as of 03/05/2022.     Review of Systems  Review of Systems  No chest pain with exertion.  No orthopnea or PND.  Comprehensive review of systems otherwise negative. Physical Exam  BP 118/70 (BP Location: Right Arm, Patient Position: Sitting, Cuff Size: Normal)   Pulse 71   Temp 98.7 F (37.1 C) (Oral)   Ht 5\' 11"  (1.803 m)   Wt 190 lb (86.2 kg)   SpO2 97%   BMI 26.50 kg/m   Wt Readings from Last 5 Encounters:  03/05/22 190 lb (86.2 kg)  01/30/22 192  lb 9.6 oz (87.4 kg)  06/26/21 203 lb (92.1 kg)  05/14/21 201 lb 3.2 oz (91.3 kg)  03/08/21 205 lb (93 kg)    BMI Readings from Last 5 Encounters:  03/05/22 26.50 kg/m  01/30/22 28.43 kg/m  06/26/21 29.98 kg/m  05/14/21 28.06 kg/m  03/08/21 28.59 kg/m  Physical Exam General: Well-appearing, no acute distress Eyes: EOMI, no icterus Neck: Supple no JVP appreciated sitting upright Pulmonary: Clear, distant Cardiovascular: Warm, no edema Abdomen: Nontender, bowel sounds present MSK: No synovitis, joint effusion Neuro: Normal gait, no weakness Psych: Normal mood, full affect   Assessment & Plan:   Asthma: diagnosed as child.  Hyperinflation on chest x-ray fits with this diagnosis.  Worsened symptomatology over the last year or 2.  Poor control recently.  Suspect large component due to inability to access controller medicines.  Airduo Digihaler high-dose 1 puff twice daily sent to local pharmacy.  Instructions on obtaining co-pay card that should decrease costs about $20 a month provided today.  Continue as needed albuterol.  May need to escalate therapies in the future but will be important to know how he responds to good adherence to ICS/LABA therapy.  Short course prednisone 20 mg daily x5 days see if there is symptomatic improvement.  Letter provided today for he and his legal team regarding difficulty with forced exhalation.  Chest tightness: Suspect related to uncontrolled asthma symptoms.  Assess response to therapy as above.     Return in about 3 months (around 06/05/2022).   Karren Burly, MD 03/05/2022

## 2022-03-09 ENCOUNTER — Other Ambulatory Visit (HOSPITAL_COMMUNITY): Payer: Self-pay

## 2022-03-09 ENCOUNTER — Telehealth: Payer: Self-pay | Admitting: Pharmacy Technician

## 2022-03-09 NOTE — Telephone Encounter (Signed)
Patient Advocate Encounter  Received notification from [facility] that prior authorization for AIRDUO [facility] [id number] is required.   PA submitted on 6.3.23 Key [id number]  Status is pending   [facility] will continue to follow  Luciano Cutter, CPhT Patient Advocate Phone: [phone]

## 2022-03-10 NOTE — Telephone Encounter (Signed)
Received a fax regarding Prior Authorization from [facility] for AIRDUO . Authorization has been DENIED because THREE OF THE FORMULARY ALTERNATIVES HAVE NOT BEEN TRIED.   Formulary alternatives are: Advair Diskus, Advair HFA, Breo Ellipta, Symbicort. (Requirement: 3 in a class with 3 or more alternatives, 2 in a class with 2 alternatives, or 1 in a class with only 1 alternative.) Note: Formulary alternatives may require a prior authorization. Your prescriber will be responsible for determining what alternative is appropriate for you.

## 2022-03-11 NOTE — Telephone Encounter (Signed)
The issue is cost for all. I advised him to use the AirDuo copay card. Can we check that he has been able to use the copay card successfully?

## 2022-03-11 NOTE — Telephone Encounter (Signed)
MH please note the following:  Received a fax regarding Prior Authorization from [facility] for AIRDUO . Authorization has been DENIED because THREE OF THE FORMULARY ALTERNATIVES HAVE NOT BEEN TRIED.    Formulary alternatives are: Advair Diskus, Advair HFA, Breo Ellipta, Symbicort. (Requirement: 3 in a class with 3 or more alternatives, 2 in a class with 2 alternatives, or 1 in a class with only 1 alternative.) Note: Formulary alternatives may require a prior authorization. Your prescriber will be responsible for determining what alternative is appropriate for you.   Please advise sir

## 2022-03-12 NOTE — Telephone Encounter (Signed)
Called and left voicemail for patient to call office back in regards to inhaler 

## 2022-05-09 ENCOUNTER — Encounter: Payer: Self-pay | Admitting: Family

## 2022-05-09 ENCOUNTER — Ambulatory Visit (INDEPENDENT_AMBULATORY_CARE_PROVIDER_SITE_OTHER): Payer: BLUE CROSS/BLUE SHIELD | Admitting: Physician Assistant

## 2022-05-09 ENCOUNTER — Encounter: Payer: Self-pay | Admitting: Physician Assistant

## 2022-05-09 VITALS — BP 119/74 | HR 56 | Temp 98.6°F | Ht 70.98 in | Wt 201.1 lb

## 2022-05-09 DIAGNOSIS — Z7689 Persons encountering health services in other specified circumstances: Secondary | ICD-10-CM

## 2022-05-09 NOTE — Progress Notes (Signed)
Established Patient Office Visit  Name: Mathew Fitzpatrick   MRN: [id number]    DOB: 1991-08-13   Date:05/09/2022  Today's Provider: Jacquelin Hawking, MHS, PA-C Introduced myself to the patient as a PA-C and provided education on APPs in clinical practice.         Subjective  Chief Complaint  Chief Complaint  Patient presents with   Letter for School/Work    Patient wants letter from here so he can go back to work after being in a treatment center for 30 days.     HPI  Return to Work Was detoxing at a facility in [place] for 30 days Was told by his job that he would need a clearance note to return to work  Reports he was at [facility] [employer] [facility] [place] in [place]  He denies any injuries or conditions that would impact his physical ability to perform his work and states he has been back for the past 2 days   Asthma Reports he needs help with his inhaler due to cost Reports he has not had his  Unsure if his selected pharmacy is in network with his insurance      Patient Active Problem List   Diagnosis Date Noted   Uncomplicated asthma 01/30/2022   Elevated cholesterol 01/30/2022   Moderate persistent asthma without complication 05/14/2021    No past surgical history on file.  No family history on file.  Social History   Tobacco Use   Smoking status: Former    Packs/day: 0.10    Types: Cigarettes, Cigars    Quit date: 11/09/2015    Years since quitting: 6.5   Smokeless tobacco: Never  Substance Use Topics   Alcohol use: Yes    Comment: daily     Current Outpatient Medications:    albuterol (PROVENTIL) (2.5 MG/3ML) 0.083% nebulizer solution, Take 3 mLs (2.5 mg total) by nebulization every 6 (six) hours as needed for wheezing or shortness of breath., Disp: 75 mL, Rfl: 12   albuterol (VENTOLIN HFA) 108 (90 Base) MCG/ACT inhaler, INHALE TWO PUFFS BY MOUTH EVERY 6 HOURS AS NEEDED FOR WHEEZE OR SHORTNESS OF BREATH, Disp: 8.5 g, Rfl: 3   Triamcinolone  Acetonide (TRIAMCINOLONE 0.1 % CREAM : EUCERIN) CREA, Apply 1 application. topically 2 (two) times daily as needed., Disp: 1 each, Rfl: 4   Fluticasone-Salmeterol,sensor, (AIRDUO DIGIHALER) 232-14 MCG/ACT AEPB, Inhale 1 puff into the lungs in the morning and at bedtime. (Patient not taking: Reported on 05/09/2022), Disp: 1 each, Rfl: 11   Tiotropium Bromide Monohydrate (SPIRIVA RESPIMAT) 1.25 MCG/ACT AERS, Inhale 2 puffs into the lungs daily. (Patient not taking: Reported on 03/05/2022), Disp: 1 each, Rfl: 5  Allergies  Allergen Reactions   Shellfish Allergy Anaphylaxis    I personally reviewed active problem list, medication list, lab results with the patient/caregiver today.   Review of Systems  Psychiatric/Behavioral:  Positive for substance abuse (reports a history of substance abuse and detox treatment completed recently).       Objective  Vitals:   05/09/22 1353  BP: 119/74  Pulse: (!) 56  Temp: 98.6 F (37 C)  TempSrc: Oral  SpO2: 98%  Weight: 201 lb 1.6 oz (91.2 kg)  Height: 5' 10.98" (1.803 m)    Body mass index is 28.06 kg/m.  Physical Exam Constitutional:      General: He is awake.     Appearance: Normal appearance. He is well-developed, well-groomed and normal weight.  Eyes:     General: Lids are normal. Gaze aligned appropriately.     Extraocular Movements: Extraocular movements intact.     Conjunctiva/sclera: Conjunctivae normal.  Pulmonary:     Effort: Pulmonary effort is normal.  Musculoskeletal:     Cervical back: Normal range of motion.  Neurological:     Mental Status: He is alert.     GCS: GCS eye subscore is 4. GCS verbal subscore is 5. GCS motor subscore is 6.     Cranial Nerves: No dysarthria or facial asymmetry.     Motor: No weakness or tremor.     Gait: Gait is intact.  Psychiatric:        Attention and Perception: He is inattentive.        Mood and Affect: Affect is angry.     No results found for this or any previous visit (from the  past 2160 hour(s)).   PHQ2/9:    05/09/2022    1:55 PM 01/30/2022   10:27 AM 06/26/2021    9:28 AM 05/14/2021    8:23 AM  Depression screen PHQ 2/9  Decreased Interest 0 0 0 0  Down, Depressed, Hopeless 0 0 0 0  PHQ - 2 Score 0 0 0 0  Altered sleeping 0 0    Tired, decreased energy 0 0    Change in appetite 0 0    Feeling bad or failure about yourself  0 0    Trouble concentrating 0 0    Moving slowly or fidgety/restless 0 0    Suicidal thoughts 0 0    PHQ-9 Score 0 0    Difficult doing work/chores Not difficult at all Not difficult at all        Fall Risk:    05/09/2022    1:55 PM 01/30/2022   10:27 AM  Fall Risk   Falls in the past year? 0 0  Number falls in past yr: 0 0  Injury with Fall? 0 0  Risk for fall due to : No Fall Risks No Fall Risks  Follow up Falls evaluation completed Falls evaluation completed      Functional Status Survey:      Assessment & Plan  Problem List Items Addressed This Visit   None Visit Diagnoses     Return to work exam    -  Primary Patient is requesting return to work note  Reports he had to take himself out of work to complete 30 days of substance abuse treatment program Patient provided certificate of completion at apt today.  He denies limitations for work and states he has been working for the last 2 days already Will provide return to work note as requested          No follow-ups on file.   I, Cordai Rodrigue E Hitesh Fouche, PA-C, have reviewed all documentation for this visit. The documentation on 05/10/22 for the exam, diagnosis, procedures, and orders are all accurate and complete.   Jacquelin Hawking, MHS, PA-C [facility]

## 2022-05-09 NOTE — Patient Instructions (Signed)
Please call your insurance company to discuss your inhalers and assistance with coverage

## 2022-06-14 ENCOUNTER — Ambulatory Visit: Payer: Self-pay | Admitting: Pulmonary Disease

## 2022-07-04 ENCOUNTER — Other Ambulatory Visit: Payer: Self-pay | Admitting: Nurse Practitioner

## 2022-07-08 NOTE — Telephone Encounter (Signed)
LVM asking patient to call back to reschedule his appointment

## 2022-08-06 ENCOUNTER — Encounter: Payer: Self-pay | Admitting: Unknown Physician Specialty

## 2022-08-06 ENCOUNTER — Ambulatory Visit (INDEPENDENT_AMBULATORY_CARE_PROVIDER_SITE_OTHER): Payer: BLUE CROSS/BLUE SHIELD | Admitting: Unknown Physician Specialty

## 2022-08-06 VITALS — BP 124/71 | HR 79 | Temp 98.8°F | Ht 70.98 in | Wt 188.1 lb

## 2022-08-06 DIAGNOSIS — Z7251 High risk heterosexual behavior: Secondary | ICD-10-CM | POA: Diagnosis not present

## 2022-08-06 DIAGNOSIS — J4540 Moderate persistent asthma, uncomplicated: Secondary | ICD-10-CM | POA: Diagnosis not present

## 2022-08-06 DIAGNOSIS — R634 Abnormal weight loss: Secondary | ICD-10-CM

## 2022-08-06 NOTE — Assessment & Plan Note (Signed)
Not to goal with taking Albuterol daily. .  Use Advair more consistently.

## 2022-08-06 NOTE — Progress Notes (Signed)
BP 124/71   Pulse 79   Temp 98.8 F (37.1 C) (Oral)   Ht 5' 10.98" (1.803 m)   Wt 188 lb 1.6 oz (85.3 kg)   BMI 26.25 kg/m    Subjective:    Patient ID: Mathew Fitzpatrick, male    DOB: 1991/02/24, 31 y.o.   MRN: [id number]  HPI: Mathew Fitzpatrick is a 31 y.o. male  Chief Complaint  Patient presents with   Asthma   STD Screening   Likes to get STD testing every 6 months.  SWW with 2 partners.  Discussed PReP and not interested at this time.  Mostly practices safe sex.    Asthma- Needs to use Albuterol once or twice a day.  Admits to not being consistent.  Does not wake up with asthma in the middle of the night. Non smoker and no smokers in the house.  Occasional marijuana use.  States allergies are a lot better.     Relevant past medical, surgical, family and social history reviewed and updated as indicated. Interim medical history since our last visit reviewed. Allergies and medications reviewed and updated.  Review of Systems  Constitutional: Negative.   Gastrointestinal: Negative.   Genitourinary: Negative.   Psychiatric/Behavioral: Negative.      Per HPI unless specifically indicated above     Objective:    BP 124/71   Pulse 79   Temp 98.8 F (37.1 C) (Oral)   Ht 5' 10.98" (1.803 m)   Wt 188 lb 1.6 oz (85.3 kg)   BMI 26.25 kg/m   Wt Readings from Last 3 Encounters:  08/06/22 188 lb 1.6 oz (85.3 kg)  05/09/22 201 lb 1.6 oz (91.2 kg)  03/05/22 190 lb (86.2 kg)    Physical Exam Constitutional:      General: He is not in acute distress.    Appearance: Normal appearance. He is well-developed.  HENT:     Head: Normocephalic and atraumatic.     Nose: Rhinorrhea present. No congestion.     Comments: Boogy mucosa Eyes:     General: Lids are normal. No scleral icterus.       Right eye: No discharge.        Left eye: No discharge.     Conjunctiva/sclera: Conjunctivae normal.  Neck:     Vascular: No carotid bruit or JVD.  Cardiovascular:     Rate and  Rhythm: Normal rate and regular rhythm.     Heart sounds: Normal heart sounds.  Pulmonary:     Effort: Pulmonary effort is normal. No respiratory distress.     Breath sounds: Normal breath sounds.  Abdominal:     Palpations: There is no hepatomegaly or splenomegaly.  Musculoskeletal:        General: Normal range of motion.     Cervical back: Normal range of motion and neck supple.  Skin:    General: Skin is warm and dry.     Coloration: Skin is not pale.     Findings: No rash.  Neurological:     Mental Status: He is alert and oriented to person, place, and time.  Psychiatric:        Behavior: Behavior normal.        Thought Content: Thought content normal.        Judgment: Judgment normal.     Results for orders placed or performed in visit on 01/30/22  CBC with Differential/Platelet  Result Value Ref Range   WBC 2.8 (L) 3.4 - 10.8  x10E3/uL   RBC 5.15 4.14 - 5.80 x10E6/uL   Hemoglobin 15.8 13.0 - 17.7 g/dL   Hematocrit 46.8 37.5 - 51.0 %   MCV 91 79 - 97 fL   MCH 30.7 26.6 - 33.0 pg   MCHC 33.8 31.5 - 35.7 g/dL   RDW 13.6 11.6 - 15.4 %   Platelets 300 150 - 450 x10E3/uL   Neutrophils 27 Not Estab. %   Lymphs 55 Not Estab. %   Monocytes 12 Not Estab. %   Eos 5 Not Estab. %   Basos 1 Not Estab. %   Neutrophils Absolute 0.8 (L) 1.4 - 7.0 x10E3/uL   Lymphocytes Absolute 1.6 0.7 - 3.1 x10E3/uL   Monocytes Absolute 0.4 0.1 - 0.9 x10E3/uL   EOS (ABSOLUTE) 0.1 0.0 - 0.4 x10E3/uL   Basophils Absolute 0.0 0.0 - 0.2 x10E3/uL   Immature Granulocytes 0 Not Estab. %   Immature Grans (Abs) 0.0 0.0 - 0.1 x10E3/uL   Hematology Comments: Note:   Comprehensive metabolic panel  Result Value Ref Range   Glucose 87 70 - 99 mg/dL   BUN 14 6 - 20 mg/dL   Creatinine, Ser 1.08 0.76 - 1.27 mg/dL   eGFR 96 >59 mL/min/1.73   BUN/Creatinine Ratio 13 9 - 20   Sodium 142 134 - 144 mmol/L   Potassium 4.6 3.5 - 5.2 mmol/L   Chloride 103 96 - 106 mmol/L   CO2 25 20 - 29 mmol/L   Calcium 10.0  8.7 - 10.2 mg/dL   Total Protein 7.0 6.0 - 8.5 g/dL   Albumin 4.8 4.1 - 5.2 g/dL   Globulin, Total 2.2 1.5 - 4.5 g/dL   Albumin/Globulin Ratio 2.2 1.2 - 2.2   Bilirubin Total 0.5 0.0 - 1.2 mg/dL   Alkaline Phosphatase 94 44 - 121 IU/L   AST 49 (H) 0 - 40 IU/L   ALT 38 0 - 44 IU/L  Urinalysis, Routine w reflex microscopic  Result Value Ref Range   Specific Gravity, UA 1.025 1.005 - 1.030   pH, UA 7.0 5.0 - 7.5   Color, UA Yellow Yellow   Appearance Ur Clear Clear   Leukocytes,UA Negative Negative   Protein,UA Negative Negative/Trace   Glucose, UA Negative Negative   Ketones, UA Negative Negative   RBC, UA Negative Negative   Bilirubin, UA Negative Negative   Urobilinogen, Ur 0.2 0.2 - 1.0 mg/dL   Nitrite, UA Negative Negative  TSH  Result Value Ref Range   TSH 1.040 0.450 - 4.500 uIU/mL  Lipid panel  Result Value Ref Range   Cholesterol, Total 226 (H) 100 - 199 mg/dL   Triglycerides 105 0 - 149 mg/dL   HDL 82 >39 mg/dL   VLDL Cholesterol Cal 18 5 - 40 mg/dL   LDL Chol Calc (NIH) 126 (H) 0 - 99 mg/dL   Chol/HDL Ratio 2.8 0.0 - 5.0 ratio      Assessment & Plan:   Problem List Items Addressed This Visit       Unprioritized   Moderate persistent asthma without complication    Not to goal with taking Albuterol daily. .  Use Advair more consistently.        Other Visit Diagnoses     High risk heterosexual behavior    -  Primary   STD checks.  Not interested in PReP at this time.     Relevant Orders   Hepatitis C antibody, reflex   Chlamydia/Gonococcus/Trichomonas, NAA([facility])   HIV antibody (with reflex)  RPR   Acute Viral Hepatitis (HAV, HBV, HCV)   Weight loss       Purposeful with exercise and fasting.  check thyroid        Follow up plan: Return in about 6 months (around 02/04/2023).

## 2022-08-07 LAB — CBC WITH DIFFERENTIAL/PLATELET
Basophils Absolute: 0.1 10*3/uL (ref 0.0–0.2)
Basos: 2 %
EOS (ABSOLUTE): 0.1 10*3/uL (ref 0.0–0.4)
Eos: 3 %
Hematocrit: 50.7 % (ref 37.5–51.0)
Hemoglobin: 16.8 g/dL (ref 13.0–17.7)
Immature Grans (Abs): 0 10*3/uL (ref 0.0–0.1)
Immature Granulocytes: 0 %
Lymphocytes Absolute: 1.8 10*3/uL (ref 0.7–3.1)
Lymphs: 59 %
MCH: 29.6 pg (ref 26.6–33.0)
MCHC: 33.1 g/dL (ref 31.5–35.7)
MCV: 89 fL (ref 79–97)
Monocytes Absolute: 0.3 10*3/uL (ref 0.1–0.9)
Monocytes: 9 %
Neutrophils Absolute: 0.8 10*3/uL — ABNORMAL LOW (ref 1.4–7.0)
Neutrophils: 27 %
Platelets: 317 10*3/uL (ref 150–450)
RBC: 5.67 x10E6/uL (ref 4.14–5.80)
RDW: 13.2 % (ref 11.6–15.4)
WBC: 3.1 10*3/uL — ABNORMAL LOW (ref 3.4–10.8)

## 2022-08-07 LAB — HCV INTERPRETATION

## 2022-08-07 LAB — COMPREHENSIVE METABOLIC PANEL
ALT: 39 IU/L (ref 0–44)
AST: 62 IU/L — ABNORMAL HIGH (ref 0–40)
Albumin/Globulin Ratio: 1.9 (ref 1.2–2.2)
Albumin: 5.3 g/dL — ABNORMAL HIGH (ref 4.3–5.2)
Alkaline Phosphatase: 107 IU/L (ref 44–121)
BUN/Creatinine Ratio: 12 (ref 9–20)
BUN: 14 mg/dL (ref 6–20)
Bilirubin Total: 0.8 mg/dL (ref 0.0–1.2)
CO2: 26 mmol/L (ref 20–29)
Calcium: 10.5 mg/dL — ABNORMAL HIGH (ref 8.7–10.2)
Chloride: 99 mmol/L (ref 96–106)
Creatinine, Ser: 1.15 mg/dL (ref 0.76–1.27)
Globulin, Total: 2.8 g/dL (ref 1.5–4.5)
Glucose: 78 mg/dL (ref 70–99)
Potassium: 5 mmol/L (ref 3.5–5.2)
Sodium: 140 mmol/L (ref 134–144)
Total Protein: 8.1 g/dL (ref 6.0–8.5)
eGFR: 88 mL/min/{1.73_m2} (ref >59)

## 2022-08-07 LAB — ACUTE VIRAL HEPATITIS (HAV, HBV, HCV)
HCV Ab: NONREACTIVE
Hep A IgM: NEGATIVE
Hep B C IgM: NEGATIVE
Hepatitis B Surface Ag: NEGATIVE

## 2022-08-07 LAB — TSH: TSH: 1.02 u[IU]/mL (ref 0.450–4.500)

## 2022-08-07 LAB — RPR: RPR Ser Ql: NONREACTIVE

## 2022-08-07 LAB — HIV ANTIBODY (ROUTINE TESTING W REFLEX): HIV Screen 4th Generation wRfx: NONREACTIVE

## 2022-08-09 LAB — CHLAMYDIA/GONOCOCCUS/TRICHOMONAS, NAA
Chlamydia by NAA: NEGATIVE
Gonococcus by NAA: NEGATIVE
Trich vag by NAA: NEGATIVE

## 2022-08-21 ENCOUNTER — Telehealth: Payer: Self-pay | Admitting: Nurse Practitioner

## 2022-08-21 MED ORDER — ALBUTEROL SULFATE HFA 108 (90 BASE) MCG/ACT IN AERS: INHALATION_SPRAY | RESPIRATORY_TRACT | 3 refills | Status: DC

## 2022-08-21 NOTE — Telephone Encounter (Signed)
Medication Refill - Medication: albuterol (VENTOLIN HFA) 108 (90 Base) MCG/ACT inhaler   Has the patient contacted their pharmacy? Yes.   (Agent: If no, request that the patient contact the pharmacy for the refill. If patient does not wish to contact the pharmacy document the reason why and proceed with request.) (Agent: If yes, when and what did the pharmacy advise?)  Preferred Pharmacy (with phone number or street name):  [id number] [place], [place] - [address] Phone: [phone]  Fax: [phone]     Has the patient been seen for an appointment in the last year OR does the patient have an upcoming appointment? Yes.    Agent: Please be advised that RX refills may take up to 3 business days. We ask that you follow-up with your pharmacy.

## 2022-10-08 ENCOUNTER — Other Ambulatory Visit: Payer: Self-pay

## 2022-10-08 MED ORDER — FLUTICASONE-SALMETEROL 250-50 MCG/ACT IN AEPB: INHALATION_SPRAY | RESPIRATORY_TRACT | 0 refills | Status: DC

## 2022-11-13 ENCOUNTER — Ambulatory Visit: Payer: Self-pay | Admitting: *Deleted

## 2022-11-13 NOTE — Telephone Encounter (Signed)
  Chief Complaint: Cough Symptoms: Productive cough, yellowish phlegm. Body aches, sore throat, sinus headache, stuffy nose, subjective fever. SOB with coughing, using inhalers more often Frequency: 2 days Pertinent Negatives: Patient denies wheezing Disposition: [] ED /[] Urgent Care (no appt availability in office) / [x] Appointment(In office/virtual)/ []  [facility]/ [] Home Care/ [] Refused Recommended Disposition /[] [facility]/ []  Follow-up with PCP Additional Notes: Secured appt for tomorrow. Pt states household covid exposure, GF positive yesterday. States neg covid, [facility] and [facility] this AM. Advised to retest early AM tomorrow. Advised UC/ED for worsening symptoms, advised given symptoms and direct exposure, self isolate. Care advise provided, pt verbalizes understanding.  Reason for Disposition  [1] Known COPD or other severe lung disease (i.e., bronchiectasis, cystic fibrosis, lung surgery) AND [2] worsening symptoms (i.e., increased sputum purulence or amount, increased breathing difficulty  Answer Assessment - Initial Assessment Questions 1. ONSET: "When did the cough begin?"      2-3 days ago 2. SEVERITY: "How bad is the cough today?"      Keeps awake 3. SPUTUM: "Describe the color of your sputum" (none, dry cough; clear, white, yellow, green)     Yellowish 4. HEMOPTYSIS: "Are you coughing up any blood?" If so ask: "How much?" (flecks, streaks, tablespoons, etc.)     no 5. DIFFICULTY BREATHING: "Are you having difficulty breathing?" If Yes, ask: "How bad is it?" (e.g., mild, moderate, severe)    - MILD: No SOB at rest, mild SOB with walking, speaks normally in sentences, can lie down, no retractions, pulse < 100.    - MODERATE: SOB at rest, SOB with minimal exertion and prefers to sit, cannot lie down flat, speaks in phrases, mild retractions, audible wheezing, pulse 100-120.    - SEVERE: Very SOB at rest, speaks in single words, struggling to  breathe, sitting hunched forward, retractions, pulse > 120      SOB with coughing, at rest 6. FEVER: "Do you have a fever?" If Yes, ask: "What is your temperature, how was it measured, and when did it start?"     Chills,sweating 7. CARDIAC HISTORY: "Do you have any history of heart disease?" (e.g., heart attack, congestive heart failure)       8. LUNG HISTORY: "Do you have any history of lung disease?"  (e.g., pulmonary embolus, asthma, emphysema)     Asthma 9. PE RISK FACTORS: "Do you have a history of blood clots?" (or: recent major surgery, recent prolonged travel, bedridden)      10. OTHER SYMPTOMS: "Do you have any other symptoms?" (e.g., runny nose, wheezing, chest pain)       Stuffy nose, using inhalers more often, body aches, sore throat  Protocols used: Cough - Acute Productive-A-AH

## 2022-11-14 ENCOUNTER — Ambulatory Visit
Admission: RE | Admit: 2022-11-14 | Discharge: 2022-11-14 | Disposition: A | Discharge status: Home / Self Care | Payer: BLUE CROSS/BLUE SHIELD | Source: Ambulatory Visit | Attending: Physician Assistant | Admitting: Physician Assistant

## 2022-11-14 ENCOUNTER — Ambulatory Visit: Payer: BLUE CROSS/BLUE SHIELD | Admitting: Physician Assistant

## 2022-11-14 ENCOUNTER — Other Ambulatory Visit: Payer: Self-pay | Admitting: Physician Assistant

## 2022-11-14 ENCOUNTER — Encounter: Payer: Self-pay | Admitting: Physician Assistant

## 2022-11-14 VITALS — BP 112/66 | HR 66 | Temp 97.6°F | Resp 16 | Ht 70.98 in | Wt 190.2 lb

## 2022-11-14 DIAGNOSIS — R062 Wheezing: Secondary | ICD-10-CM | POA: Diagnosis not present

## 2022-11-14 DIAGNOSIS — J069 Acute upper respiratory infection, unspecified: Secondary | ICD-10-CM

## 2022-11-14 MED ORDER — FLUTICASONE-SALMETEROL 250-50 MCG/ACT IN AEPB: INHALATION_SPRAY | RESPIRATORY_TRACT | 0 refills | Status: DC

## 2022-11-14 MED ORDER — PREDNISONE 20 MG PO TABS: 40.0000 mg | ORAL_TABLET | Freq: Every day | ORAL | 0 refills | Status: AC

## 2022-11-14 NOTE — Patient Instructions (Addendum)
Based on your described symptoms and the duration of symptoms it is likely that you have a viral upper respiratory infection (often called a "cold")  Symptoms can last for 3-10 days with lingering cough and intermittent symptoms lasting weeks after that.  The goal of treatment at this time is to reduce your symptoms and discomfort   I have sent in a Prednisone burst to help with your breathing. Please try to get your Advair inhaler and continue to use your rescue inhaler as needed for breathing trouble or coughing.  You can use over the counter medications such as Dayquil/Nyquil, AlkaSeltzer formulations, etc to provide further relief of symptoms according to the manufacturer's instructions  The tablet formulations of these medications should not contain alcohol and should help provide relief of your symptoms.    If your symptoms do not improve or become worse in the next 5-7 days please make an apt at the office so we can see you  Go to the ER if you begin to have more serious symptoms such as shortness of breath, trouble breathing, loss of consciousness, swelling around the eyes, high fever, severe lasting headaches, vision changes or neck pain/stiffness.    Please go here for your  xray   [address], [place] [address]    It was nice to meet you and I appreciate the opportunity to be involved in your care If you were satisfied with the care you received from me, I would greatly appreciate you saying so in the after-visit survey that is sent out following our visit.

## 2022-11-14 NOTE — Progress Notes (Signed)
Acute Office Visit   Patient: Mathew Fitzpatrick   DOB: 03-11-1991   32 y.o. Male  MRN: [id number] Visit Date: 11/14/2022  Today's healthcare provider: Dani Gobble Menna Abeln, PA-C  Introduced myself to the patient as a Journalist, newspaper and provided education on APPs in clinical practice.    Chief Complaint  Patient presents with   Cough   Generalized Body Aches   Shortness of Breath    Onset for 3-4 days   Subjective    HPI HPI     Shortness of Breath    Additional comments: Onset for 3-4 days      Last edited by Salomon Fick, CMA on 11/14/2022 10:18 AM.       Uri-Type symptoms  Onset: sudden  Duration: about 3-4 days  Associated Symptoms: Reports productive cough, nasal congestion and rhinorrhea  States symptoms are worse at night  Interventions: nothing yet   Recent Sick contacts: yes girlfriend has been sick and tested positive for COVID on 11/12/22  COVID testing at home: yes he [facility] but both were negative on 11/13/22   He has had to use his rescue inhalers more frequently since becoming ill He has not had his Advair due to insurance coverage     Medications: Outpatient Medications Prior to Visit  Medication Sig   albuterol (PROVENTIL) (2.5 MG/3ML) 0.083% nebulizer solution Take 3 mLs (2.5 mg total) by nebulization every 6 (six) hours as needed for wheezing or shortness of breath.   albuterol (VENTOLIN HFA) 108 (90 Base) MCG/ACT inhaler INHALE TWO PUFFS BY MOUTH EVERY 6 HOURS AS NEEDED FOR WHEEZE OR SHORTNESS OF BREATH   fluticasone-salmeterol (ADVAIR DISKUS) 250-50 MCG/ACT AEPB INHALE ONE PUFF BY MOUTH EVERY MORNING AND INHALE ONE PUFF BY MOUTH EVERY NIGHT AT BEDTIME   Triamcinolone Acetonide (TRIAMCINOLONE 0.1 % CREAM : EUCERIN) CREA Apply 1 application. topically 2 (two) times daily as needed.   No facility-administered medications prior to visit.    Review of Systems  Constitutional:  Positive for chills, diaphoresis and  fatigue. Negative for fever.  HENT:  Positive for congestion, postnasal drip, rhinorrhea and sore throat. Negative for ear pain.   Respiratory:  Positive for cough and shortness of breath. Negative for wheezing.   Gastrointestinal:  Positive for diarrhea. Negative for nausea and vomiting.  Musculoskeletal:  Positive for myalgias.  Neurological:  Positive for headaches. Negative for dizziness.       Objective    BP 112/66   Pulse 66   Temp 97.6 F (36.4 C) (Oral)   Resp 16   Ht 5' 10.98" (1.803 m)   Wt 190 lb 3.2 oz (86.3 kg)   SpO2 98%   BMI 26.54 kg/m    Physical Exam Vitals reviewed.  Constitutional:      General: He is awake.     Appearance: Normal appearance. He is well-developed and well-groomed.  HENT:     Head: Normocephalic and atraumatic.     Mouth/Throat:     Mouth: Mucous membranes are moist.     Pharynx: No pharyngeal swelling or oropharyngeal exudate.  Eyes:     Extraocular Movements: Extraocular movements intact.     Pupils: Pupils are equal, round, and reactive to light.  Cardiovascular:     Rate and Rhythm: Normal rate and regular rhythm.     Pulses: Normal pulses.     Heart sounds: Normal heart sounds. No murmur heard.  No friction rub. No gallop.  Pulmonary:     Effort: Pulmonary effort is normal.     Breath sounds: Normal breath sounds. No decreased breath sounds, wheezing, rhonchi or rales.  Musculoskeletal:     Cervical back: Normal range of motion and neck supple.  Lymphadenopathy:     Head:     Right side of head: No submental, submandibular or preauricular adenopathy.     Left side of head: No submental, submandibular or preauricular adenopathy.     Cervical:     Right cervical: No superficial or posterior cervical adenopathy.    Left cervical: No superficial or posterior cervical adenopathy.     Upper Body:     Right upper body: No supraclavicular adenopathy.     Left upper body: No supraclavicular adenopathy.  Skin:    General: Skin  is warm.  Neurological:     General: No focal deficit present.     Mental Status: He is alert and oriented to person, place, and time. Mental status is at baseline.  Psychiatric:        Mood and Affect: Mood normal.        Behavior: Behavior normal. Behavior is cooperative.        Thought Content: Thought content normal.        Judgment: Judgment normal.       No results found for any visits on 11/14/22.  Assessment & Plan      No follow-ups on file.      Problem List Items Addressed This Visit   None Visit Diagnoses     Wheezing on auscultation    -  Primary Acute new concern, likely secondary to current URI which has probably led to asthma exacerbation CXR was negative for signs of pneumonia - result note sent to patient via Mychart and follow up phone call Will provide Prednisone burst - 40 mg PO QD x 7 days to help with symptoms and send Advair to in-network pharmacy so he can start using daily  Reviewed use of his rescue inhaler Reviewed return and ED precautions Follow up as needed    Relevant Medications   predniSONE (DELTASONE) 20 MG tablet   fluticasone-salmeterol (ADVAIR DISKUS) 250-50 MCG/ACT AEPB   Other Relevant Orders   DG Chest 2 View (Completed)   Upper respiratory tract infection, unspecified type     Acute, new concern He reports a member of his household recently tested positive for COVID  COVID, flu testing completed today - results to dictate further management Recommend OTC symptom management at this time and will provide Prednisone to assist with acute wheezing/ asthma exacerbation Patient was amenable to antivirals if he is still within therapeutic window when results return- will check for medication interactions at that time Follow up as needed for persistent or progressing symptoms    Relevant Medications   predniSONE (DELTASONE) 20 MG tablet   Other Relevant Orders   COVID-19, Flu A+B and RSV        No follow-ups on file.   I, Eligah Anello  E Wael Maestas, PA-C, have reviewed all documentation for this visit. The documentation on 11/14/22 for the exam, diagnosis, procedures, and orders are all accurate and complete.   Talitha Givens, MHS, PA-C [facility]

## 2022-11-14 NOTE — Progress Notes (Signed)
Your chest xray did not show signs of pneumonia at this time. I recommend that we proceed with starting the Prednisone and getting your Advair so you can use it daily. You can use over the counter medications such as Mucinex, Robitussin and Tylenol to help with your symptoms (or take Dayquil, theraflu, or Alkaseltzer) per your choice. We will keep you updated on the results of your COVID and flu testing as it becomes available.

## 2022-11-14 NOTE — Telephone Encounter (Signed)
Requested medication (s) are due for refill today:   Written to dy by Junie Panning Mecum  Requested medication (s) are on the active medication list:   New rx  Future visit scheduled:   Yes   Last ordered: Today 2/8 60 each, 0 refills  Returned because insurance will only cover a 90 day supply.  If appropriate, please send new Rx.     Requested Prescriptions  Pending Prescriptions Disp Refills   WIXELA INHUB 250-50 MCG/ACT AEPB [Pharmacy Med Name: WIXELA 250-50 INHUB] 60 each 0    Sig: INHALE ONE PUFF BY MOUTH EVERY MORNING AND INHALE ONE PUFF BY MOUTH EVERY NIGHT AT BEDTIME     Pulmonology:  [employer] Passed - 11/14/2022 11:36 AM      Passed - Valid encounter within last 12 months    Recent Outpatient Visits           Today Wheezing on auscultation   [facility] Mecum, Dani Gobble, PA-C   3 months ago High risk heterosexual behavior   [place] Kathrine Haddock, NP   6 months ago Return to work exam   [employer], Dani Gobble, PA-C   9 months ago Uncomplicated asthma, unspecified asthma severity, unspecified whether persistent   [place] Vigg, Avanti, MD   1 year ago Routine general medical examination at a health care facility   [facility] Charyl Dancer, NP       Future Appointments             In 2 months Jon Billings, NP [place], [place]

## 2022-11-15 ENCOUNTER — Telehealth: Payer: Self-pay | Admitting: Nurse Practitioner

## 2022-11-15 LAB — COVID-19, FLU A+B AND RSV
Influenza A, NAA: NOT DETECTED
Influenza B, NAA: NOT DETECTED
RSV, NAA: NOT DETECTED
SARS-CoV-2, NAA: NOT DETECTED

## 2022-11-15 NOTE — Progress Notes (Signed)
Negative for COVID, flu and RSV Please proceed with symptomatic management as discussed at your apt.

## 2022-11-15 NOTE — Telephone Encounter (Signed)
Requested medication (s) are due for refill today: yes  Requested medication (s) are on the active medication list: yes  Last refill:  11/14/22  Future visit scheduled: yes  Notes to clinic:  t: Alternative Requested:MUST FILL 90DAY FOR INS TO COVER. PLEASE SEND IN NEW RX. THANKS.      Requested Prescriptions  Pending Prescriptions Disp Refills   WIXELA INHUB 250-50 MCG/ACT AEPB [Pharmacy Med Name: WIXELA 250-50 INHUB] 60 each 0    Sig: INHALE ONE PUFF BY MOUTH EVERY MORNING AND INHALE ONE PUFF BY MOUTH EVERY NIGHT AT BEDTIME     Pulmonology:  Comb[employer] Passed - 11/14/2022  5:48 PM      Passed - Valid encounter within last 12 months    Recent Outpatient Visits           Yesterday Wheezing on auscultation   [facility] Mecum, Dani Gobble, PA-C   3 months ago High risk heterosexual behavior   [place] Kathrine Haddock, NP   6 months ago Return to work exam   [employer], Dani Gobble, PA-C   9 months ago Uncomplicated asthma, unspecified asthma severity, unspecified whether persistent   [place] Vigg, Avanti, MD   1 year ago Routine general medical examination at a health care facility   [facility] Charyl Dancer, NP       Future Appointments             In 2 months Jon Billings, NP [place], [place]

## 2022-12-16 ENCOUNTER — Other Ambulatory Visit: Payer: Self-pay | Admitting: Nurse Practitioner

## 2022-12-16 MED ORDER — ALBUTEROL SULFATE HFA 108 (90 BASE) MCG/ACT IN AERS: INHALATION_SPRAY | RESPIRATORY_TRACT | 3 refills | Status: DC

## 2022-12-16 NOTE — Telephone Encounter (Signed)
Requested Prescriptions  Pending Prescriptions Disp Refills   albuterol (VENTOLIN HFA) 108 (90 Base) MCG/ACT inhaler 8.5 g 3    Sig: INHALE TWO PUFFS BY MOUTH EVERY 6 HOURS AS NEEDED FOR WHEEZE OR SHORTNESS OF BREATH     Pulmonology:  [employer] 2 Passed - 12/16/2022  4:58 PM      Passed - Last BP in normal range    BP Readings from Last 1 Encounters:  11/14/22 112/66         Passed - Last Heart Rate in normal range    Pulse Readings from Last 1 Encounters:  11/14/22 66         Passed - Valid encounter within last 12 months    Recent Outpatient Visits           1 month ago Wheezing on auscultation   [place] [facility] Mecum, Dani Gobble, PA-C   4 months ago High risk heterosexual behavior   [place] Kathrine Haddock, NP   7 months ago Return to work exam   [employer], Dani Gobble, PA-C   10 months ago Uncomplicated asthma, unspecified asthma severity, unspecified whether persistent   [place] Vigg, Avanti, MD   1 year ago Routine general medical examination at a health care facility   [facility] Charyl Dancer, NP       Future Appointments             In 1 month Jon Billings, NP [place], [facility]

## 2022-12-16 NOTE — Telephone Encounter (Signed)
Medication Refill - Medication: albuterol (VENTOLIN HFA) 108 (90 Base) MCG/ACT inhaler   Pt is out of medication and states that it is an emergency that he get his refill.   Has the patient contacted their pharmacy? Yes.   Pt referred to PCP.    Preferred Pharmacy (with phone number [facility] street name[id number] [phone] Fax: [phone]    Has the patient been seen for an appointment in the last year OR does the patient have an upcoming appointment? Yes.    Agent: Please be advised that RX refills may take up to 3 business days. We ask that you follow-up with your pharmacy.

## 2023-01-10 ENCOUNTER — Other Ambulatory Visit: Payer: Self-pay | Admitting: Nurse Practitioner

## 2023-01-10 NOTE — Telephone Encounter (Signed)
Requested medications are due for refill today.  Unsure - see note  Requested medications are on the active medications list.  no  Last refill.   Future visit scheduled.   yes  Notes to clinic.  Pharmacy comment: Alternative Requested:NON FORMULARY ITEM.    Requested Prescriptions  Pending Prescriptions Disp Refills   levalbuterol (XOPENEX HFA) 45 MCG/ACT inhaler [Pharmacy Med Name: LEVALBUTEROL TAR HFA INH]  0     Pulmonology:  [employer] 2 Passed - 01/10/2023  5:03 PM      Passed - Last BP in normal range    BP Readings from Last 1 Encounters:  11/14/22 112/66         Passed - Last Heart Rate in normal range    Pulse Readings from Last 1 Encounters:  11/14/22 66         Passed - Valid encounter within last 12 months    Recent Outpatient Visits           1 month ago Wheezing on auscultation   [place] [facility] Mecum, Oswaldo Conroy, PA-C   5 months ago High risk heterosexual behavior   [facility] [place] [facility] Gabriel Cirri, NP   8 months ago Return to work exam   [employer], Oswaldo Conroy, PA-C   11 months ago Uncomplicated asthma, unspecified asthma severity, unspecified whether persistent   [place] [facility] Vigg, Avanti, MD   1 year ago Routine general medical examination at a health care facility   [facility] Gerre Scull, NP       Future Appointments             In 3 weeks Larae Grooms, NP [place] [facility], [facility]

## 2023-02-04 ENCOUNTER — Ambulatory Visit: Payer: BLUE CROSS/BLUE SHIELD | Admitting: Nurse Practitioner

## 2023-02-04 ENCOUNTER — Encounter: Payer: Self-pay | Admitting: Nurse Practitioner

## 2023-02-04 VITALS — BP 92/55 | HR 60 | Temp 98.5°F | Ht 70.0 in | Wt 180.9 lb

## 2023-02-04 DIAGNOSIS — E7800 Pure hypercholesterolemia, unspecified: Secondary | ICD-10-CM

## 2023-02-04 DIAGNOSIS — Z0000 Encounter for general adult medical examination without abnormal findings: Secondary | ICD-10-CM

## 2023-02-04 DIAGNOSIS — Z113 Encounter for screening for infections with a predominantly sexual mode of transmission: Secondary | ICD-10-CM | POA: Diagnosis not present

## 2023-02-04 DIAGNOSIS — J0110 Acute frontal sinusitis, unspecified: Secondary | ICD-10-CM | POA: Diagnosis not present

## 2023-02-04 LAB — URINALYSIS, ROUTINE W REFLEX MICROSCOPIC
Bilirubin, UA: NEGATIVE
Glucose, UA: NEGATIVE
Ketones, UA: NEGATIVE
Leukocytes,UA: NEGATIVE
Nitrite, UA: NEGATIVE
Protein,UA: NEGATIVE
RBC, UA: NEGATIVE
Specific Gravity, UA: 1.02 (ref 1.005–1.030)
Urobilinogen, Ur: 0.2 mg/dL (ref 0.2–1.0)
pH, UA: 6.5 (ref 5.0–7.5)

## 2023-02-04 MED ORDER — AMOXICILLIN 500 MG PO CAPS: 500.0000 mg | ORAL_CAPSULE | Freq: Two times a day (BID) | ORAL | 0 refills | Status: AC

## 2023-02-04 NOTE — Progress Notes (Signed)
BP (!) 92/55   Pulse 60   Temp 98.5 F (36.9 C) (Oral)   Ht 5\' 10"  (1.778 m)   Wt 180 lb 14.4 oz (82.1 kg)   SpO2 98%   BMI 25.96 kg/m    Subjective:    Patient ID: Mathew Fitzpatrick, male    DOB: 24-Aug-1991, 32 y.o.   MRN: [id number]  HPI: Mathew Fitzpatrick is a 32 y.o. male presenting on 02/04/2023 for comprehensive medical examination. Current medical complaints include: allergies  He currently lives with: Interim Problems from his last visit: no  HYPERLIPIDEMIA Hyperlipidemia status: excellent compliance Satisfied with current treatment?  no Side effects:  no Medication compliance: excellent compliance Past cholesterol meds: none Supplements: none Aspirin:  no The ASCVD Risk score (Arnett DK, et al., 2019) failed to calculate for the following reasons:   The 2019 ASCVD risk score is only valid for ages 59 to 32 Chest pain:  no Coronary artery disease:  no Family history CAD:  no Family history early CAD:  no  UPPER RESPIRATORY TRACT INFECTION Worst symptom: 2-3 weeks Fever: no Cough: yes Shortness of breath: no Wheezing: yes Chest pain: no Chest tightness: no Chest congestion: no Nasal congestion: yes Runny nose: yes Post nasal drip: yes Sneezing: yes Sore throat: yes Swollen glands: no Sinus pressure: yes Headache: yes Face pain: yes Toothache: yes Ear pain: no bilateral Ear pressure: no bilateral Eyes red/itching:no Eye drainage/crusting: no  Vomiting: no Rash: no Fatigue: yes Sick contacts: no Strep contacts: no  Context: stable Recurrent sinusitis: no Relief with OTC cold/cough medications: no  Treatments attempted: anti-histamine     Depression Screen done today and results listed below:     02/04/2023    9:11 AM 11/14/2022   10:13 AM 05/09/2022    1:55 PM 01/30/2022   10:27 AM 06/26/2021    9:28 AM  Depression screen PHQ 2/9  Decreased Interest 0 0 0 0 0  Down, Depressed, Hopeless 0 0 0 0 0  PHQ - 2 Score 0 0 0 0 0  Altered  sleeping 0 0 0 0   Tired, decreased energy 0 0 0 0   Change in appetite 0 0 0 0   Feeling bad or failure about yourself  0 0 0 0   Trouble concentrating 0 0 0 0   Moving slowly or fidgety/restless 0 0 0 0   Suicidal thoughts 0 0 0 0   PHQ-9 Score 0 0 0 0   Difficult doing work/chores Not difficult at all Not difficult at all Not difficult at all Not difficult at all     The patient does not have a history of falls. I did complete a risk assessment for falls. A plan of care for falls was documented.   Past Medical History:  Past Medical History:  Diagnosis Date   Asthma    Bronchitis     Surgical History:  History reviewed. No pertinent surgical history.  Medications:  Current Outpatient Medications on File Prior to Visit  Medication Sig   fluticasone-salmeterol (WIXELA INHUB) 250-50 MCG/ACT AEPB INHALE ONE PUFF BY MOUTH EVERY MORNING AND INHALE ONE PUFF BY MOUTH EVERY NIGHT AT BEDTIME   levalbuterol (XOPENEX HFA) 45 MCG/ACT inhaler Inhale 1 puff into the lungs every 8 (eight) hours as needed for wheezing.   [DISCONTINUED] Tiotropium Bromide Monohydrate (SPIRIVA RESPIMAT) 1.25 MCG/ACT AERS Inhale 2 puffs into the lungs daily. (Patient not taking: Reported on 03/05/2022)   No current facility-administered medications on  file prior to visit.    Allergies:  Allergies  Allergen Reactions   Shellfish Allergy Anaphylaxis    Social History:  Social History   Socioeconomic History   Marital status: Single    Spouse name: Not on file   Number of children: Not on file   Years of education: Not on file   Highest education level: Not on file  Occupational History   Not on file  Tobacco Use   Smoking status: Former    Packs/day: .1    Types: Cigarettes, Cigars    Quit date: 11/09/2015    Years since quitting: 7.2   Smokeless tobacco: Never  Vaping Use   Vaping Use: Never used  Substance and Sexual Activity   Alcohol use: Not Currently   Drug use: Not Currently   Sexual  activity: Yes    Birth control/protection: None  Other Topics Concern   Not on file  Social History Narrative   Not on file   Social Determinants of Health   Financial Resource Strain: Not on file  Food Insecurity: Not on file  Transportation Needs: Not on file  Physical Activity: Not on file  Stress: Not on file  Social Connections: Not on file  Intimate Partner Violence: Not At Risk (11/08/2020)   Humiliation, Afraid, Rape, and Kick questionnaire    Fear of Current or Ex-Partner: No    Emotionally Abused: No    Physically Abused: No    Sexually Abused: No   Social History   Tobacco Use  Smoking Status Former   Packs/day: .1   Types: Cigarettes, Cigars   Quit date: 11/09/2015   Years since quitting: 7.2  Smokeless Tobacco Never   Social History   Substance and Sexual Activity  Alcohol Use Not Currently    Family History:  History reviewed. No pertinent family history.  Past medical history, surgical history, medications, allergies, family history and social history reviewed with patient today and changes made to appropriate areas of the chart.   Review of Systems  Constitutional:  Positive for malaise/fatigue.  HENT:  Positive for congestion. Negative for ear pain and sore throat.   Respiratory:  Positive for cough and wheezing. Negative for shortness of breath.   Neurological:  Positive for headaches.   All other ROS negative except what is listed above and in the HPI.      Objective:    BP (!) 92/55   Pulse 60   Temp 98.5 F (36.9 C) (Oral)   Ht 5\' 10"  (1.778 m)   Wt 180 lb 14.4 oz (82.1 kg)   SpO2 98%   BMI 25.96 kg/m   Wt Readings from Last 3 Encounters:  02/04/23 180 lb 14.4 oz (82.1 kg)  11/14/22 190 lb 3.2 oz (86.3 kg)  08/06/22 188 lb 1.6 oz (85.3 kg)    Physical Exam Vitals and nursing note reviewed.  Constitutional:      General: He is not in acute distress.    Appearance: Normal appearance. He is not ill-appearing, toxic-appearing or  diaphoretic.  HENT:     Head: Normocephalic.     Right Ear: Tympanic membrane, ear canal and external ear normal.     Left Ear: Tympanic membrane, ear canal and external ear normal.     Nose: Congestion and rhinorrhea present.     Right Sinus: Frontal sinus tenderness present. No maxillary sinus tenderness.     Left Sinus: Frontal sinus tenderness present. No maxillary sinus tenderness.     Mouth/Throat:  Mouth: Mucous membranes are moist.     Pharynx: Posterior oropharyngeal erythema present. No oropharyngeal exudate.  Eyes:     General:        Right eye: No discharge.        Left eye: No discharge.     Extraocular Movements: Extraocular movements intact.     Conjunctiva/sclera: Conjunctivae normal.     Pupils: Pupils are equal, round, and reactive to light.  Cardiovascular:     Rate and Rhythm: Normal rate and regular rhythm.     Heart sounds: No murmur heard. Pulmonary:     Effort: Pulmonary effort is normal. No respiratory distress.     Breath sounds: Normal breath sounds. No wheezing, rhonchi or rales.  Abdominal:     General: Abdomen is flat. Bowel sounds are normal. There is no distension.     Palpations: Abdomen is soft.     Tenderness: There is no abdominal tenderness. There is no guarding.  Musculoskeletal:     Cervical back: Normal range of motion and neck supple.  Skin:    General: Skin is warm and dry.     Capillary Refill: Capillary refill takes less than 2 seconds.  Neurological:     General: No focal deficit present.     Mental Status: He is alert and oriented to person, place, and time.     Cranial Nerves: No cranial nerve deficit.     Motor: No weakness.     Deep Tendon Reflexes: Reflexes normal.  Psychiatric:        Mood and Affect: Mood normal.        Behavior: Behavior normal.        Thought Content: Thought content normal.        Judgment: Judgment normal.     Results for orders placed or performed in visit on 11/14/22  COVID-19, Flu A+B and  RSV   Specimen: Nasal Swab   Nasal Swab  Previously  Result Value Ref Range   SARS-CoV-2, NAA Not Detected Not Detected   Influenza A, NAA Not Detected Not Detected   Influenza B, NAA Not Detected Not Detected   RSV, NAA Not Detected Not Detected   Test Information: Comment       Assessment & Plan:   Problem List Items Addressed This Visit       Other   Elevated cholesterol    Labs ordered at visit today.  Will make recommendations based on lab results.        Relevant Orders   Lipid panel   Other Visit Diagnoses     Annual physical exam    -  Primary   Health maintenance reviewed during visit today.  Labs ordered.  Declined vaccines.   Relevant Orders   TSH   Lipid panel   CBC with Differential/Platelet   Comprehensive metabolic panel   Urinalysis, Routine w reflex microscopic   Screening examination for STI       Not having any symptoms.  Would like to be checked every 6 months.   Relevant Orders   HIV Antibody (routine testing w rflx)   RPR   Hepatitis C antibody   Chlamydia/Gonococcus/Trichomonas, NAA   HSV 1 and 2 Ab, IgG   Acute non-recurrent frontal sinusitis       Will treat with amoxicillin.  Continue with Claritin and inhalers PRN for symptoms.   Relevant Medications   amoxicillin (AMOXIL) 500 MG capsule        Discussed aspirin prophylaxis for  myocardial infarction prevention and decision was it was not indicated  LABORATORY TESTING:  Health maintenance labs ordered today as discussed above.     IMMUNIZATIONS:   - Tdap: Tetanus vaccination status reviewed: Refused. - Influenza: Postponed to flu season - Pneumovax: Not applicable - Prevnar: Not applicable - COVID: Not applicable - HPV: Not applicable - Shingrix vaccine: Not applicable  SCREENING: - Colonoscopy: Not applicable  Discussed with patient purpose of the colonoscopy is to detect colon cancer at curable precancerous or early stages   - AAA Screening: Not applicable  -Hearing  Test: Not applicable  -Spirometry: Not applicable   PATIENT COUNSELING:    Sexuality: Discussed sexually transmitted diseases, partner selection, use of condoms, avoidance of unintended pregnancy  and contraceptive alternatives.   Advised to avoid cigarette smoking.  I discussed with the patient that most people either abstain from alcohol or drink within safe limits (<=14/week and <=4 drinks/occasion for males, <=7/weeks and <= 3 drinks/occasion for females) and that the risk for alcohol disorders and other health effects rises proportionally with the number of drinks per week and how often a drinker exceeds daily limits.  Discussed cessation/primary prevention of drug use and availability of treatment for abuse.   Diet: Encouraged to adjust caloric intake to maintain  or achieve ideal body weight, to reduce intake of dietary saturated fat and total fat, to limit sodium intake by avoiding high sodium foods and not adding table salt, and to maintain adequate dietary potassium and calcium preferably from fresh fruits, vegetables, and low-fat dairy products.    stressed the importance of regular exercise  Injury prevention: Discussed safety belts, safety helmets, smoke detector, smoking near bedding or upholstery.   Dental health: Discussed importance of regular tooth brushing, flossing, and dental visits.   Follow up plan: NEXT PREVENTATIVE PHYSICAL DUE IN 1 YEAR. Return in about 6 months (around 08/06/2023) for HLD and STI.

## 2023-02-04 NOTE — Assessment & Plan Note (Signed)
Labs ordered at visit today.  Will make recommendations based on lab results.   

## 2023-02-05 LAB — RPR: RPR Ser Ql: NONREACTIVE

## 2023-02-05 LAB — HEPATITIS C ANTIBODY: Hep C Virus Ab: NONREACTIVE

## 2023-02-06 LAB — LIPID PANEL
Chol/HDL Ratio: 2.8 ratio (ref 0.0–5.0)
Cholesterol, Total: 185 mg/dL (ref 100–199)
HDL: 66 mg/dL (ref >39)
LDL Chol Calc (NIH): 103 mg/dL — ABNORMAL HIGH (ref 0–99)
Triglycerides: 90 mg/dL (ref 0–149)
VLDL Cholesterol Cal: 16 mg/dL (ref 5–40)

## 2023-02-06 LAB — COMPREHENSIVE METABOLIC PANEL
ALT: 41 IU/L (ref 0–44)
AST: 45 IU/L — ABNORMAL HIGH (ref 0–40)
Albumin/Globulin Ratio: 1.6 (ref 1.2–2.2)
Albumin: 4.6 g/dL (ref 4.3–5.2)
Alkaline Phosphatase: 89 IU/L (ref 44–121)
BUN/Creatinine Ratio: 19 (ref 9–20)
BUN: 22 mg/dL — ABNORMAL HIGH (ref 6–20)
Bilirubin Total: 0.3 mg/dL (ref 0.0–1.2)
CO2: 22 mmol/L (ref 20–29)
Calcium: 9.6 mg/dL (ref 8.7–10.2)
Chloride: 103 mmol/L (ref 96–106)
Creatinine, Ser: 1.15 mg/dL (ref 0.76–1.27)
Globulin, Total: 2.8 g/dL (ref 1.5–4.5)
Glucose: 81 mg/dL (ref 70–99)
Potassium: 4.8 mmol/L (ref 3.5–5.2)
Sodium: 141 mmol/L (ref 134–144)
Total Protein: 7.4 g/dL (ref 6.0–8.5)
eGFR: 88 mL/min/{1.73_m2} (ref >59)

## 2023-02-06 LAB — HSV 1 AND 2 AB, IGG
HSV 1 Glycoprotein G Ab, IgG: 53.2 index — ABNORMAL HIGH (ref 0.00–0.90)
HSV 2 IgG, Type Spec: <0.91 index (ref 0.00–0.90)

## 2023-02-06 LAB — CBC WITH DIFFERENTIAL/PLATELET
Basophils Absolute: 0.1 10*3/uL (ref 0.0–0.2)
Basos: 2 %
EOS (ABSOLUTE): 0.1 10*3/uL (ref 0.0–0.4)
Eos: 5 %
Hematocrit: 44.1 % (ref 37.5–51.0)
Hemoglobin: 14.6 g/dL (ref 13.0–17.7)
Immature Grans (Abs): 0 10*3/uL (ref 0.0–0.1)
Immature Granulocytes: 0 %
Lymphocytes Absolute: 1.4 10*3/uL (ref 0.7–3.1)
Lymphs: 54 %
MCH: 30.5 pg (ref 26.6–33.0)
MCHC: 33.1 g/dL (ref 31.5–35.7)
MCV: 92 fL (ref 79–97)
Monocytes Absolute: 0.3 10*3/uL (ref 0.1–0.9)
Monocytes: 10 %
Neutrophils Absolute: 0.8 10*3/uL — ABNORMAL LOW (ref 1.4–7.0)
Neutrophils: 29 %
Platelets: 316 10*3/uL (ref 150–450)
RBC: 4.79 x10E6/uL (ref 4.14–5.80)
RDW: 12.4 % (ref 11.6–15.4)
WBC: 2.7 10*3/uL — ABNORMAL LOW (ref 3.4–10.8)

## 2023-02-06 LAB — CHLAMYDIA/GONOCOCCUS/TRICHOMONAS, NAA
Chlamydia by NAA: NEGATIVE
Gonococcus by NAA: NEGATIVE
Trich vag by NAA: NEGATIVE

## 2023-02-06 LAB — TSH: TSH: 0.55 u[IU]/mL (ref 0.450–4.500)

## 2023-02-06 LAB — HIV ANTIBODY (ROUTINE TESTING W REFLEX): HIV Screen 4th Generation wRfx: NONREACTIVE

## 2023-02-06 NOTE — Progress Notes (Signed)
Hi Mathew Fitzpatrick. It was nice to see you this week.  Your lab work looks good.  No evidence of STI.  No concerns at this time. Continue with your current medication regimen.  Follow up as discussed.  Please let me know if you have any questions.

## 2023-04-22 ENCOUNTER — Other Ambulatory Visit: Payer: Self-pay | Admitting: Nurse Practitioner

## 2023-04-23 NOTE — Telephone Encounter (Signed)
Requested Prescriptions  Pending Prescriptions Disp Refills   levalbuterol (XOPENEX HFA) 45 MCG/ACT inhaler [Pharmacy Med Name: LEVALBUTEROL TAR HFA INH] 15 each 0    Sig: INHALE 1 PUFF INTO THE LUNGS EVERY 8 (EIGHT) HOURS AS NEEDED FOR WHEEZING.     Pulmonology:  [facility] 2 Passed - 04/22/2023  5:52 PM      Passed - Last BP in normal range    BP Readings from Last 1 Encounters:  02/04/23 (!) 92/55         Passed - Last Heart Rate in normal range    Pulse Readings from Last 1 Encounters:  02/04/23 60         Passed - Valid encounter within last 12 months    Recent Outpatient Visits           2 months ago Annual physical exam   [place] [facility] Larae Grooms, NP   5 months ago Wheezing on auscultation   [place] [facility] Mecum, Oswaldo Conroy, PA-C   8 months ago High risk heterosexual behavior   [place] [facility] Gabriel Cirri, NP   11 months ago Return to work exam   [place] [facility] Mecum, Oswaldo Conroy, PA-C   1 year ago Uncomplicated asthma, unspecified asthma severity, unspecified whether persistent   [place] [facility] Vigg, Avanti, MD       Future Appointments             In 3 months Larae Grooms, NP [place] [facility]

## 2023-07-14 ENCOUNTER — Other Ambulatory Visit: Payer: Self-pay | Admitting: Physician Assistant

## 2023-07-15 NOTE — Telephone Encounter (Signed)
Requested Prescriptions  Pending Prescriptions Disp Refills   levalbuterol (XOPENEX HFA) 45 MCG/ACT inhaler [Pharmacy Med Name: LEVALBUTEROL TAR HFA INH] 15 each 0    Sig: INHALE 1 PUFF BY MOUTH INTO LUNGS EVERY 8 HOURS AS NEEDED FOR WHEEZING     Pulmonology:  [facility] 2 Passed - 07/14/2023  7:34 PM      Passed - Last BP in normal range    BP Readings from Last 1 Encounters:  02/04/23 (!) 92/55         Passed - Last Heart Rate in normal range    Pulse Readings from Last 1 Encounters:  02/04/23 60         Passed - Valid encounter within last 12 months    Recent Outpatient Visits           5 months ago Annual physical exam   [place] [facility] Larae Grooms, NP   8 months ago Wheezing on auscultation   [place] [facility] Mecum, Oswaldo Conroy, PA-C   11 months ago High risk heterosexual behavior   [place] [facility] Gabriel Cirri, NP   1 year ago Return to work exam   [place] Mecum, Oswaldo Conroy, PA-C   1 year ago Uncomplicated asthma, unspecified asthma severity, unspecified whether persistent   [place] [facility] Vigg, Avanti, MD       Future Appointments             In 3 weeks Larae Grooms, NP [place] [facility], [facility]

## 2023-07-19 ENCOUNTER — Other Ambulatory Visit: Payer: Self-pay

## 2023-07-19 ENCOUNTER — Emergency Department
Admission: EM | Admit: 2023-07-19 | Discharge: 2023-07-19 | Disposition: A | Discharge status: Home / Self Care | Payer: BLUE CROSS/BLUE SHIELD | Attending: Emergency Medicine | Admitting: Emergency Medicine

## 2023-07-19 DIAGNOSIS — Z711 Person with feared health complaint in whom no diagnosis is made: Secondary | ICD-10-CM | POA: Insufficient documentation

## 2023-07-19 LAB — CHLAMYDIA/NGC RT PCR (ARMC ONLY)
Chlamydia Tr: NOT DETECTED
N gonorrhoeae: NOT DETECTED

## 2023-07-19 LAB — URINALYSIS, ROUTINE W REFLEX MICROSCOPIC
Bilirubin Urine: NEGATIVE
Glucose, UA: NEGATIVE mg/dL
Hgb urine dipstick: NEGATIVE
Ketones, ur: 5 mg/dL — AB
Leukocytes,Ua: NEGATIVE
Nitrite: NEGATIVE
Protein, ur: NEGATIVE mg/dL
Specific Gravity, Urine: 1.033 — ABNORMAL HIGH (ref 1.005–1.030)
pH: 5 (ref 5.0–8.0)

## 2023-07-19 NOTE — Discharge Instructions (Signed)
Follow-up with your primary care provider or the [facility] department.  Watch for any signs of blisters that would be suggestive of herpes with would be a cluster of blisters

## 2023-07-19 NOTE — ED Notes (Signed)
Pt A&O x4, no obvious distress noted, respirations regular/unlabored. Pt verbalizes understanding of discharge instructions. Pt able to ambulate from ED independently.   

## 2023-07-19 NOTE — ED Triage Notes (Signed)
Pt comes with c/o possible STD. Pt states he had interaction with someone month ago who told him they tested positive for std. Pt denies any symptoms.

## 2023-07-19 NOTE — ED Provider Notes (Signed)
[facility] Provider Note    Event Date/Time   First MD Initiated Contact with Patient 07/19/23 1119     (approximate)   History    HPI  Mathew Fitzpatrick is a 32 y.o. male   presents to the ED wanting to be tested for STDs.  Patient denies any symptoms or penile discharge.  He states that someone that he slept with 1 month ago notified him that she tested positive for herpes.  He reports that this is not an ongoing relationship.  Patient denies any outbreak of blisters, vesicles, dysuria, hematuria or penile discharge.   Physical Exam   Triage Vital Signs: ED Triage Vitals [07/19/23 1056]  Encounter Vitals Group     BP 133/67     Systolic BP Percentile      Diastolic BP Percentile      Pulse Rate 76     Resp 18     Temp 98 F (36.7 C)     Temp src      SpO2 100 %     Weight      Height      Head Circumference      Peak Flow      Pain Score 0     Pain Loc      Pain Education      Exclude from Growth Chart     Most recent vital signs: Vitals:   07/19/23 1056  BP: 133/67  Pulse: 76  Resp: 18  Temp: 98 F (36.7 C)  SpO2: 100%     General: Awake, no distress.  CV:  Good peripheral perfusion.  Resp:  Normal effort.  Abd:  No distention.  Other:     ED Results / Procedures / Treatments   Labs (all labs ordered are listed, but only abnormal results are displayed) Labs Reviewed  URINALYSIS, ROUTINE W REFLEX MICROSCOPIC - Abnormal; Notable for the following components:      Result Value   Color, Urine YELLOW (*)    APPearance CLOUDY (*)    Specific Gravity, Urine 1.033 (*)    Ketones, ur 5 (*)    All other components within normal limits  CHLAMYDIA/NGC RT PCR ([facility] ONLY)                PROCEDURES:  Critical Care performed:   Procedures   MEDICATIONS ORDERED IN ED: Medications - No data to display   IMPRESSION / MDM / ASSESSMENT AND PLAN / ED COURSE  I reviewed the triage vital signs and the nursing  notes.   Differential diagnosis includes, but is not limited to, feared complaint without diagnosis, possible exposure to STD.  32 year old male presents to the ED with concerns of possible exposure to an STD.  Patient denies any symptoms but wants testing.  He reports that he gets STD testing every 6 months because he chooses unprotected sex.  He does have a PCP that he goes to rather than the health department.  We did discuss a follow-up at the health department but he prefers to see his PCP.  Urinalysis today was unremarkable and patient was made aware that he can see the results of his GC and Chlamydia test in MyChart.  Patient at this time has no symptoms.      Patient's presentation is most consistent with acute, uncomplicated illness.  FINAL CLINICAL IMPRESSION(S) / ED DIAGNOSES   Final diagnoses:  Feared complaint without diagnosis     Rx / DC Orders  ED Discharge Orders     None        Note:  This document was prepared using Dragon voice recognition software and may include unintentional dictation errors.   Tommi Rumps, PA-C 07/19/23 1407    Sharman Cheek, MD 07/20/23 [phone]

## 2023-07-21 ENCOUNTER — Ambulatory Visit (INDEPENDENT_AMBULATORY_CARE_PROVIDER_SITE_OTHER): Payer: Self-pay | Admitting: Family Medicine

## 2023-07-21 ENCOUNTER — Encounter: Payer: Self-pay | Admitting: Nurse Practitioner

## 2023-07-21 VITALS — BP 138/88 | HR 73 | Ht 70.47 in | Wt 179.6 lb

## 2023-07-21 DIAGNOSIS — E7800 Pure hypercholesterolemia, unspecified: Secondary | ICD-10-CM

## 2023-07-21 DIAGNOSIS — Z113 Encounter for screening for infections with a predominantly sexual mode of transmission: Secondary | ICD-10-CM | POA: Insufficient documentation

## 2023-07-21 NOTE — Patient Instructions (Signed)
Apply Vaseline in skin folds if dryness occurs

## 2023-07-21 NOTE — Progress Notes (Signed)
BP 138/88   Pulse 73   Ht 5' 10.47" (1.79 m)   Wt 179 lb 9.6 oz (81.5 kg)   SpO2 98%   BMI 25.43 kg/m    Subjective:    Patient ID: Mathew Fitzpatrick, male    DOB: August 10, 1991, 32 y.o.   MRN: [id number]  HPI: Mathew Fitzpatrick is a 32 y.o. male  Chief Complaint  Patient presents with   Exposure to STD    Recent partner tested positive for HSV   Hyperlipidemia   STD SCREENING Has some dryness in skinfold of left groin area. He admits to pulling the area inspecting it after possible exposure to HSV from previous sex partner. Denies pain or tenderness to touch to this area. He does admit to razor use in this area.  Sexual activity:  Recent unprotected sexual encounter Recent unprotected intercourse: yes History of sexually transmitted diseases: no Previous sexually transmitted disease screening: yes Lifetime sexual partners: 2 Genital lesions: no Penile discharge: no Dysuria: no Swollen lymph nodes: no Fevers: no Rash: no   Patient admits to dietary changes in regards to his elevated LDL at previous visit. He has decreased his intake of fried foods, and adding more fruits and vegetables to his diet. Exercise consist of mostly weight lifting 150 minutes weekly.   Relevant past medical, surgical, family and social history reviewed and updated as indicated. Interim medical history since our last visit reviewed. Allergies and medications reviewed and updated.  Review of Systems  Constitutional:  Negative for fever.  Respiratory: Negative.    Cardiovascular: Negative.   Genitourinary:  Negative for dysuria, penile discharge, penile pain, penile swelling, scrotal swelling and testicular pain.  Skin:  Negative for rash.    Per HPI unless specifically indicated above     Objective:    BP 138/88   Pulse 73   Ht 5' 10.47" (1.79 m)   Wt 179 lb 9.6 oz (81.5 kg)   SpO2 98%   BMI 25.43 kg/m   Wt Readings from Last 3 Encounters:  07/21/23 179 lb 9.6 oz (81.5 kg)  02/04/23  180 lb 14.4 oz (82.1 kg)  11/14/22 190 lb 3.2 oz (86.3 kg)    Physical Exam Vitals and nursing note reviewed.  Constitutional:      General: He is awake. He is not in acute distress.    Appearance: Normal appearance. He is well-developed and well-groomed. He is not ill-appearing.  HENT:     Head: Normocephalic and atraumatic.     Right Ear: Hearing and external ear normal. No drainage.     Left Ear: Hearing and external ear normal. No drainage.     Nose: Nose normal.  Eyes:     General: Lids are normal.        Right eye: No discharge.        Left eye: No discharge.     Conjunctiva/sclera: Conjunctivae normal.  Cardiovascular:     Rate and Rhythm: Normal rate and regular rhythm.     Pulses:          Radial pulses are 2+ on the right side and 2+ on the left side.       Posterior tibial pulses are 2+ on the right side and 2+ on the left side.     Heart sounds: Normal heart sounds, S1 normal and S2 normal. No murmur heard.    No gallop.  Pulmonary:     Effort: Pulmonary effort is normal. No accessory muscle usage  or respiratory distress.     Breath sounds: Normal breath sounds.  Musculoskeletal:        General: Normal range of motion.     Cervical back: Full passive range of motion without pain and normal range of motion.     Right lower leg: No edema.     Left lower leg: No edema.  Skin:    General: Skin is warm and dry.     Capillary Refill: Capillary refill takes less than 2 seconds.  Neurological:     Mental Status: He is alert and oriented to person, place, and time.  Psychiatric:        Attention and Perception: Attention normal.        Mood and Affect: Mood normal.        Speech: Speech normal.        Behavior: Behavior normal. Behavior is cooperative.        Thought Content: Thought content normal.     Results for orders placed or performed during the hospital encounter of 07/19/23  Chlamydia/NGC rt PCR ([facility] only)   Specimen: Urine, Random; GU  Result Value Ref  Range   Specimen source GC/Chlam URINE, RANDOM    Chlamydia Tr NOT DETECTED NOT DETECTED   N gonorrhoeae NOT DETECTED NOT DETECTED  Urinalysis, Routine w reflex microscopic -Urine, Random  Result Value Ref Range   Color, Urine YELLOW (A) YELLOW   APPearance CLOUDY (A) CLEAR   Specific Gravity, Urine 1.033 (H) 1.005 - 1.030   pH 5.0 5.0 - 8.0   Glucose, UA NEGATIVE NEGATIVE mg/dL   Hgb urine dipstick NEGATIVE NEGATIVE   Bilirubin Urine NEGATIVE NEGATIVE   Ketones, ur 5 (A) NEGATIVE mg/dL   Protein, ur NEGATIVE NEGATIVE mg/dL   Nitrite NEGATIVE NEGATIVE   Leukocytes,Ua NEGATIVE NEGATIVE      Assessment & Plan:   Problem List Items Addressed This Visit     Elevated cholesterol    Chronic, stable. Will order labs at next visit. Recommend continue plan of decreased fried, greasy foods, and increased fruits, vegetables, and 150 mins physical activity weekly.       Screening examination for STI - Primary    Patient has concerns for possible herpes exposure. Ordered HSV1+2, HIV, and RPR. Negative chlamydia and gonorrhea result in ED 2 days ago. Education given on benefits of protected sex and contraception. Will treat based on results.      Relevant Orders   HSV 1 and 2 Ab, IgG   RPR   HIV Antibody (routine testing w rflx)     Follow up plan: Return in about 6 months (around 01/19/2024), or Physical.

## 2023-07-21 NOTE — Assessment & Plan Note (Addendum)
Patient has concerns for possible herpes exposure. Ordered HSV1+2, HIV, and RPR. Negative chlamydia and gonorrhea result in ED 2 days ago. Education given on benefits of protected sex and contraception. Will treat based on results.

## 2023-07-21 NOTE — Assessment & Plan Note (Addendum)
Chronic, stable. Will order labs at next visit. Recommend continue plan of decreased fried, greasy foods, and increased fruits, vegetables, and 150 mins physical activity weekly.

## 2023-07-22 LAB — HSV 1 AND 2 AB, IGG
HSV 1 Glycoprotein G Ab, IgG: REACTIVE — AB
HSV 2 IgG, Type Spec: NONREACTIVE

## 2023-07-22 LAB — HIV ANTIBODY (ROUTINE TESTING W REFLEX): HIV Screen 4th Generation wRfx: NONREACTIVE

## 2023-07-22 LAB — RPR: RPR Ser Ql: NONREACTIVE

## 2023-07-22 NOTE — Progress Notes (Signed)
Hi Mathew Fitzpatrick, HSV 1 levels remain reactive, which often causes oral herpes, which can result in cold sores or fever blisters on or around the mouth. There is no evidence of a STI. Thank you for allowing me to participate in your care.

## 2023-08-05 NOTE — Progress Notes (Deleted)
   There were no vitals taken for this visit.   Subjective:    Patient ID: Mathew Fitzpatrick, male    DOB: Jun 01, 1991, 32 y.o.   MRN: [id number]  HPI: Mathew Fitzpatrick is a 32 y.o. male  No chief complaint on file.  HYPERLIPIDEMIA Hyperlipidemia status: {Blank single:19197::"excellent compliance","good compliance","fair compliance","poor compliance"} Satisfied with current treatment?  {Blank single:19197::"yes","no"} Side effects:  {Blank single:19197::"yes","no"} Medication compliance: {Blank single:19197::"excellent compliance","good compliance","fair compliance","poor compliance"} Past cholesterol meds: {Blank multiple:19196::"none","atorvastain (lipitor)","lovastatin (mevacor)","pravastatin (pravachol)","rosuvastatin (crestor)","simvastatin (zocor)","vytorin","fenofibrate (tricor)","gemfibrozil","ezetimide (zetia)","niaspan","lovaza"} Supplements: {Blank multiple:19196::"none","fish oil","niacin","red yeast rice"} Aspirin:  {Blank single:19197::"yes","no"} The ASCVD Risk score (Arnett DK, et al., 2019) failed to calculate for the following reasons:   The 2019 ASCVD risk score is only valid for ages 64 to 37 Chest pain:  {Blank single:19197::"yes","no"} Coronary artery disease:  {Blank single:19197::"yes","no"} Family history CAD:  {Blank single:19197::"yes","no"} Family history early CAD:  {Blank single:19197::"yes","no"}  ASTHMA Asthma status: {Blank single:19197::"controlled","uncontrolled","better","worse","exacerbated","stable"} Satisfied with current treatment?: {Blank single:19197::"yes","no"} Albuterol/rescue inhaler frequency:  Dyspnea frequency:  Wheezing frequency: Cough frequency:  Nocturnal symptom frequency:  Limitation of activity: {Blank single:19197::"yes","no"} Current upper respiratory symptoms: {Blank single:19197::"yes","no"} Triggers:  Home peak flows: Last Spirometry:  Failed/intolerant to following asthma meds:  Asthma meds in past:   Aerochamber/spacer use: {Blank single:19197::"yes","no"} Visits to ER or Urgent Care in past year: {Blank single:19197::"yes","no"} Pneumovax: {Blank single:19197::"Up to Date","Not up to Date","unknown"} Influenza: {Blank single:19197::"Up to Date","Not up to Date","unknown"}  Relevant past medical, surgical, family and social history reviewed and updated as indicated. Interim medical history since our last visit reviewed. Allergies and medications reviewed and updated.  Review of Systems  Per HPI unless specifically indicated above     Objective:    There were no vitals taken for this visit.  Wt Readings from Last 3 Encounters:  07/21/23 179 lb 9.6 oz (81.5 kg)  02/04/23 180 lb 14.4 oz (82.1 kg)  11/14/22 190 lb 3.2 oz (86.3 kg)    Physical Exam  Results for orders placed or performed in visit on 07/21/23  HSV 1 and 2 Ab, IgG  Result Value Ref Range   HSV 1 Glycoprotein G Ab, IgG Reactive (A) Non Reactive   HSV 2 IgG, Type Spec Non Reactive Non Reactive  RPR  Result Value Ref Range   RPR Ser Ql Non Reactive Non Reactive  HIV Antibody (routine testing w rflx)  Result Value Ref Range   HIV Screen 4th Generation wRfx Non Reactive Non Reactive      Assessment & Plan:   Problem List Items Addressed This Visit   None    Follow up plan: No follow-ups on file.

## 2023-08-06 ENCOUNTER — Ambulatory Visit: Payer: Self-pay | Admitting: Nurse Practitioner

## 2023-09-10 ENCOUNTER — Other Ambulatory Visit: Payer: Self-pay | Admitting: Physician Assistant

## 2023-09-12 NOTE — Telephone Encounter (Signed)
Requested Prescriptions  Pending Prescriptions Disp Refills   levalbuterol (XOPENEX HFA) 45 MCG/ACT inhaler [Pharmacy Med Name: LEVALBUTEROL TAR HFA INH] 15 each 0    Sig: INHALE 1 PUFF BY MOUTH INTO LUNGS EVERY 8 HOURS AS NEEDED FOR WHEEZING     Pulmonology:  Beta Agonists 2 Passed - 09/10/2023  4:32 PM      Passed - Last BP in normal range    BP Readings from Last 1 Encounters:  07/21/23 138/88         Passed - Last Heart Rate in normal range    Pulse Readings from Last 1 Encounters:  07/21/23 73         Passed - Valid encounter within last 12 months    Recent Outpatient Visits           1 month ago Screening examination for STI   [place] [facility] [place], Sherran Needs, NP   7 months ago Annual physical exam   [place] [facility] Larae Grooms, NP   10 months ago Wheezing on auscultation   [place] [facility] Mecum, Oswaldo Conroy, PA-C   1 year ago High risk heterosexual behavior   [place] [facility] Gabriel Cirri, NP   1 year ago Return to work exam   [employer] Mecum, Oswaldo Conroy, PA-C       Future Appointments             In 4 months Larae Grooms, NP  [facility]

## 2023-11-24 ENCOUNTER — Other Ambulatory Visit: Payer: Self-pay | Admitting: Physician Assistant

## 2023-11-24 DIAGNOSIS — R062 Wheezing: Secondary | ICD-10-CM

## 2023-11-25 NOTE — Telephone Encounter (Signed)
Requested Prescriptions  Pending Prescriptions Disp Refills   fluticasone-salmeterol (WIXELA INHUB) 250-50 MCG/ACT AEPB [Pharmacy Med Name: WIXELA 250-50 INHUB] 180 each 0    Sig: INHALE ONE PUFF BY MOUTH EVERY MORNING AND INHALE ONE PUFF BY MOUTH EVERY NIGHT AT BEDTIME     Pulmonology:  Combination Products Passed - 11/25/2023 12:43 PM      Passed - Valid encounter within last 12 months    Recent Outpatient Visits           4 months ago Screening examination for STI   [place] [facility] [place], Sherran Needs, NP   9 months ago Annual physical exam   [place] [facility] Larae Grooms, NP   1 year ago Wheezing on auscultation   [place] [facility] Mecum, Oswaldo Conroy, PA-C   1 year ago High risk heterosexual behavior   [place] [facility] Gabriel Cirri, NP   1 year ago Return to work exam   [employer] Mecum, Oswaldo Conroy, PA-C       Future Appointments             In 1 month Larae Grooms, NP [facility] [facility]

## 2024-01-20 ENCOUNTER — Encounter: Payer: Self-pay | Admitting: Nurse Practitioner

## 2024-02-10 ENCOUNTER — Encounter: Payer: Self-pay | Admitting: Nurse Practitioner

## 2024-02-10 NOTE — Progress Notes (Deleted)
 There were no vitals taken for this visit.   Subjective:    Patient ID: Mathew Fitzpatrick, male    DOB: 07/02/1991, 33 y.o.   MRN: [id number]  HPI: Mathew Fitzpatrick is a 33 y.o. male presenting on 02/10/2024 for comprehensive medical examination. Current medical complaints include: allergies  He currently lives with: Interim Problems from his last visit: no  HYPERLIPIDEMIA Hyperlipidemia status: excellent compliance Satisfied with current treatment?  no Side effects:  no Medication compliance: excellent compliance Past cholesterol meds: none Supplements: none Aspirin:  no The ASCVD Risk score (Arnett DK, et al., 2019) failed to calculate for the following reasons:   The 2019 ASCVD risk score is only valid for ages 55 to 61 Chest pain:  no Coronary artery disease:  no Family history CAD:  no Family history early CAD:  no   Depression Screen done today and results listed below:     07/21/2023    1:33 PM 02/04/2023    9:11 AM 11/14/2022   10:13 AM 05/09/2022    1:55 PM 01/30/2022   10:27 AM  Depression screen PHQ 2/9  Decreased Interest 0 0 0 0 0  Down, Depressed, Hopeless 0 0 0 0 0  PHQ - 2 Score 0 0 0 0 0  Altered sleeping 0 0 0 0 0  Tired, decreased energy 0 0 0 0 0  Change in appetite 0 0 0 0 0  Feeling bad or failure about yourself  0 0 0 0 0  Trouble concentrating 0 0 0 0 0  Moving slowly or fidgety/restless 0 0 0 0 0  Suicidal thoughts 0 0 0 0 0  PHQ-9 Score 0 0 0 0 0  Difficult doing work/chores Not difficult at all Not difficult at all Not difficult at all Not difficult at all Not difficult at all    The patient does not have a history of falls. I did complete a risk assessment for falls. A plan of care for falls was documented.   Past Medical History:  Past Medical History:  Diagnosis Date   Asthma    Bronchitis     Surgical History:  No past surgical history on file.  Medications:  Current Outpatient Medications on File Prior to Visit  Medication  Sig   fluticasone -salmeterol (WIXELA INHUB) 250-50 MCG/ACT AEPB INHALE ONE PUFF BY MOUTH EVERY MORNING AND INHALE ONE PUFF BY MOUTH EVERY NIGHT AT BEDTIME   levalbuterol (XOPENEX HFA) 45 MCG/ACT inhaler INHALE 1 PUFF BY MOUTH INTO LUNGS EVERY 8 HOURS AS NEEDED FOR WHEEZING   [DISCONTINUED] Tiotropium Bromide Monohydrate  (SPIRIVA  RESPIMAT) 1.25 MCG/ACT AERS Inhale 2 puffs into the lungs daily. (Patient not taking: Reported on 03/05/2022)   No current facility-administered medications on file prior to visit.    Allergies:  Allergies  Allergen Reactions   Shellfish Allergy Anaphylaxis    Social History:  Social History   Socioeconomic History   Marital status: Single    Spouse name: Not on file   Number of children: Not on file   Years of education: Not on file   Highest education level: Not on file  Occupational History   Not on file  Tobacco Use   Smoking status: Former    Current packs/day: 0.00    Types: Cigarettes, Cigars    Quit date: 11/09/2015    Years since quitting: 8.2   Smokeless tobacco: Never  Vaping Use   Vaping status: Never Used  Substance and Sexual Activity   Alcohol  use: Not Currently   Drug use: Not Currently   Sexual activity: Yes    Birth control/protection: None  Other Topics Concern   Not on file  Social History Narrative   Not on file   Social Drivers of Health   Financial Resource Strain: Not on file  Food Insecurity: Not on file  Transportation Needs: Not on file  Physical Activity: Not on file  Stress: Not on file  Social Connections: Not on file  Intimate Partner Violence: Not At Risk (11/08/2020)   Humiliation, Afraid, Rape, and Kick questionnaire    Fear of Current or Ex-Partner: No    Emotionally Abused: No    Physically Abused: No    Sexually Abused: No   Social History   Tobacco Use  Smoking Status Former   Current packs/day: 0.00   Types: Cigarettes, Cigars   Quit date: 11/09/2015   Years since quitting: 8.2  Smokeless  Tobacco Never   Social History   Substance and Sexual Activity  Alcohol Use Not Currently    Family History:  No family history on file.  Past medical history, surgical history, medications, allergies, family history and social history reviewed with patient today and changes made to appropriate areas of the chart.   Review of Systems  Constitutional:  Positive for malaise/fatigue.  HENT:  Positive for congestion. Negative for ear pain and sore throat.   Respiratory:  Positive for cough and wheezing. Negative for shortness of breath.   Neurological:  Positive for headaches.   All other ROS negative except what is listed above and in the HPI.      Objective:    There were no vitals taken for this visit.  Wt Readings from Last 3 Encounters:  07/21/23 179 lb 9.6 oz (81.5 kg)  02/04/23 180 lb 14.4 oz (82.1 kg)  11/14/22 190 lb 3.2 oz (86.3 kg)    Physical Exam Vitals and nursing note reviewed.  Constitutional:      General: He is not in acute distress.    Appearance: Normal appearance. He is not ill-appearing, toxic-appearing or diaphoretic.  HENT:     Head: Normocephalic.     Right Ear: Tympanic membrane, ear canal and external ear normal.     Left Ear: Tympanic membrane, ear canal and external ear normal.     Nose: Congestion and rhinorrhea present.     Right Sinus: Frontal sinus tenderness present. No maxillary sinus tenderness.     Left Sinus: Frontal sinus tenderness present. No maxillary sinus tenderness.     Mouth/Throat:     Mouth: Mucous membranes are moist.     Pharynx: Posterior oropharyngeal erythema present. No oropharyngeal exudate.  Eyes:     General:        Right eye: No discharge.        Left eye: No discharge.     Extraocular Movements: Extraocular movements intact.     Conjunctiva/sclera: Conjunctivae normal.     Pupils: Pupils are equal, round, and reactive to light.  Cardiovascular:     Rate and Rhythm: Normal rate and regular rhythm.     Heart  sounds: No murmur heard. Pulmonary:     Effort: Pulmonary effort is normal. No respiratory distress.     Breath sounds: Normal breath sounds. No wheezing, rhonchi or rales.  Abdominal:     General: Abdomen is flat. Bowel sounds are normal. There is no distension.     Palpations: Abdomen is soft.     Tenderness: There is  no abdominal tenderness. There is no guarding.  Musculoskeletal:     Cervical back: Normal range of motion and neck supple.  Skin:    General: Skin is warm and dry.     Capillary Refill: Capillary refill takes less than 2 seconds.  Neurological:     General: No focal deficit present.     Mental Status: He is alert and oriented to person, place, and time.     Cranial Nerves: No cranial nerve deficit.     Motor: No weakness.     Deep Tendon Reflexes: Reflexes normal.  Psychiatric:        Mood and Affect: Mood normal.        Behavior: Behavior normal.        Thought Content: Thought content normal.        Judgment: Judgment normal.     Results for orders placed or performed in visit on 07/21/23  HSV 1 and 2 Ab, IgG   Collection Time: 07/21/23  2:12 PM  Result Value Ref Range   HSV 1 Glycoprotein G Ab, IgG Reactive (A) Non Reactive   HSV 2 IgG, Type Spec Non Reactive Non Reactive  RPR   Collection Time: 07/21/23  2:12 PM  Result Value Ref Range   RPR Ser Ql Non Reactive Non Reactive  HIV Antibody (routine testing w rflx)   Collection Time: 07/21/23  2:12 PM  Result Value Ref Range   HIV Screen 4th Generation wRfx Non Reactive Non Reactive      Assessment & Plan:   Problem List Items Addressed This Visit       Other   Elevated cholesterol - Primary     Discussed aspirin prophylaxis for myocardial infarction prevention and decision was it was not indicated  LABORATORY TESTING:  Health maintenance labs ordered today as discussed above.     IMMUNIZATIONS:   - Tdap: Tetanus vaccination status reviewed: Refused. - Influenza: Postponed to flu  season - Pneumovax: Not applicable - Prevnar: Not applicable - COVID: Not applicable - HPV: Not applicable - Shingrix vaccine: Not applicable  SCREENING: - Colonoscopy: Not applicable  Discussed with patient purpose of the colonoscopy is to detect colon cancer at curable precancerous or early stages   - AAA Screening: Not applicable  -Hearing Test: Not applicable  -Spirometry: Not applicable   PATIENT COUNSELING:    Sexuality: Discussed sexually transmitted diseases, partner selection, use of condoms, avoidance of unintended pregnancy  and contraceptive alternatives.   Advised to avoid cigarette smoking.  I discussed with the patient that most people either abstain from alcohol or drink within safe limits (<=14/week and <=4 drinks/occasion for males, <=7/weeks and <= 3 drinks/occasion for females) and that the risk for alcohol disorders and other health effects rises proportionally with the number of drinks per week and how often a drinker exceeds daily limits.  Discussed cessation/primary prevention of drug use and availability of treatment for abuse.   Diet: Encouraged to adjust caloric intake to maintain  or achieve ideal body weight, to reduce intake of dietary saturated fat and total fat, to limit sodium intake by avoiding high sodium foods and not adding table salt, and to maintain adequate dietary potassium and calcium preferably from fresh fruits, vegetables, and low-fat dairy products.    stressed the importance of regular exercise  Injury prevention: Discussed safety belts, safety helmets, smoke detector, smoking near bedding or upholstery.   Dental health: Discussed importance of regular tooth brushing, flossing, and dental visits.  Follow up plan: NEXT PREVENTATIVE PHYSICAL DUE IN 1 YEAR. No follow-ups on file.

## 2024-05-09 ENCOUNTER — Other Ambulatory Visit: Payer: Self-pay | Admitting: Nurse Practitioner

## 2024-05-11 NOTE — Telephone Encounter (Signed)
 Requested medication (s) are due for refill today: yes  Requested medication (s) are on the active medication list: yes  Last refill:  09/12/23 15 each 3 RF  Future visit scheduled: no  Notes to clinic:  called pt to schedule appointment. No answer. Sent pt message via MyChart to make appt.    Requested Prescriptions  Pending Prescriptions Disp Refills   levalbuterol (XOPENEX HFA) 45 MCG/ACT inhaler [Pharmacy Med Name: LEVALBUTEROL TAR HFA INH] 15 each 1    Sig: INHALE 1 PUFF BY MOUTH INTO LUNGS EVERY 8 HOURS AS NEEDED FOR WHEEZING     Pulmonology:  Beta Agonists 2 Failed - 05/11/2024 10:21 AM      Failed - Valid encounter within last 12 months    Recent Outpatient Visits   None            Passed - Last BP in normal range    BP Readings from Last 1 Encounters:  07/21/23 138/88         Passed - Last Heart Rate in normal range    Pulse Readings from Last 1 Encounters:  07/21/23 73

## 2024-05-12 ENCOUNTER — Other Ambulatory Visit: Payer: Self-pay | Admitting: Nurse Practitioner

## 2024-05-14 NOTE — Telephone Encounter (Signed)
 Unable to refill per protocol, appointment needed.   Requested Prescriptions  Pending Prescriptions Disp Refills   levalbuterol (XOPENEX HFA) 45 MCG/ACT inhaler [Pharmacy Med Name: LEVALBUTEROL TAR HFA INH] 15 each 1    Sig: INHALE 1 PUFF BY MOUTH INTO LUNGS EVERY 8 HOURS AS NEEDED FOR WHEEZING     Pulmonology:  Beta Agonists 2 Failed - 05/14/2024  1:56 PM      Failed - Valid encounter within last 12 months    Recent Outpatient Visits   None            Passed - Last BP in normal range    BP Readings from Last 1 Encounters:  07/21/23 138/88         Passed - Last Heart Rate in normal range    Pulse Readings from Last 1 Encounters:  07/21/23 73

## 2024-09-17 ENCOUNTER — Encounter: Payer: Self-pay | Admitting: Nurse Practitioner

## 2024-09-17 ENCOUNTER — Ambulatory Visit: Payer: Self-pay

## 2024-09-17 ENCOUNTER — Ambulatory Visit: Admitting: Nurse Practitioner

## 2024-09-17 VITALS — BP 102/66 | HR 69 | Temp 98.1°F | Ht 70.0 in | Wt 212.4 lb

## 2024-09-17 DIAGNOSIS — E7800 Pure hypercholesterolemia, unspecified: Secondary | ICD-10-CM

## 2024-09-17 DIAGNOSIS — J4541 Moderate persistent asthma with (acute) exacerbation: Secondary | ICD-10-CM

## 2024-09-17 DIAGNOSIS — Z113 Encounter for screening for infections with a predominantly sexual mode of transmission: Secondary | ICD-10-CM | POA: Diagnosis not present

## 2024-09-17 MED ORDER — HYDROCOD POLI-CHLORPHE POLI ER 10-8 MG/5ML PO SUER: 5.0000 mL | Freq: Every evening | ORAL | 0 refills | Status: AC | PRN

## 2024-09-17 MED ORDER — PREDNISONE 20 MG PO TABS: 40.0000 mg | ORAL_TABLET | Freq: Every day | ORAL | 0 refills | Status: AC

## 2024-09-17 MED ORDER — AZITHROMYCIN 250 MG PO TABS: ORAL_TABLET | ORAL | 0 refills | Status: AC

## 2024-09-17 NOTE — Patient Instructions (Signed)

## 2024-09-17 NOTE — Assessment & Plan Note (Signed)
 Noted on past labs. Would like labs rechecked today. Lipid panel obtained.

## 2024-09-17 NOTE — Assessment & Plan Note (Addendum)
 Chronic, exacerbated. Poor control as unable to afford maintenance inhalers. Will place referral to [facility] to see if any assistance available or recommendations, discussed with him today. Continue Xopenex at this time. For exacerbation sent in Prednisone  40 MG daily for 5 days and Zpack. Tussionex for cough, no refills. Follow-up with PCP. Labs today.

## 2024-09-17 NOTE — Progress Notes (Signed)
 BP 102/66   Pulse 69   Temp 98.1 F (36.7 C) (Oral)   Ht 5' 10 (1.778 m)   Wt 212 lb 6.4 oz (96.3 kg)   SpO2 96%   BMI 30.48 kg/m    Subjective:    Patient ID: Mathew Fitzpatrick, male    DOB: 1990-11-17, 33 y.o.   MRN: [id number]  HPI: Mathew Fitzpatrick is a 33 y.o. male  Chief Complaint  Patient presents with   Asthma    Patient states he is almost out of his inhaler and needs a refill.    ASTHMA Using Levalbuterol (Xopenex). Maintenance inhalers were too costly and does not take. Has had exacerbation symptoms for 3 weeks now. Asthma status: exacerbated Satisfied with current treatment?: yes Albuterol /rescue inhaler frequency: 10 times a day Dyspnea frequency: a little bit  Wheezing frequency: a little bit Cough frequency: yes, more frequent Nocturnal symptom frequency: wakes up a lot and uses inhaler Limitation of activity: no Current upper respiratory symptoms: yes Triggers: season changes Home peak flows:none Last Spirometry: unknown Failed/intolerant to following asthma meds: none Asthma meds in past: Spiriva  and Wixela Aerochamber/spacer use: no Visits to ER or Urgent Care in past year: no Pneumovax: refuses Influenza: refuses  The ASCVD Risk score (Arnett DK, et al., 2019) failed to calculate for the following reasons:   The 2019 ASCVD risk score is only valid for ages 77 to 49   STD SCREENING Sexual activity:  In a Monogamous Relationship Contraception: no Recent unprotected intercourse: yes History of sexually transmitted diseases: no Previous sexually transmitted disease screening: yes Lifetime sexual partners: over 55 Genital lesions: no Penile discharge: no Dysuria: no Swollen lymph nodes: no Fevers: no Rash: no   Relevant past medical, surgical, family and social history reviewed and updated as indicated. Interim medical history since our last visit reviewed. Allergies and medications reviewed and updated.  Review of Systems   Constitutional:  Negative for activity change, chills, diaphoresis, fatigue and fever.  Respiratory:  Positive for cough, chest tightness, shortness of breath and wheezing.   Cardiovascular:  Negative for chest pain, palpitations and leg swelling.  Gastrointestinal: Negative.   Endocrine: Negative.   Neurological: Negative.   Psychiatric/Behavioral: Negative.      Per HPI unless specifically indicated above     Objective:    BP 102/66   Pulse 69   Temp 98.1 F (36.7 C) (Oral)   Ht 5' 10 (1.778 m)   Wt 212 lb 6.4 oz (96.3 kg)   SpO2 96%   BMI 30.48 kg/m   Wt Readings from Last 3 Encounters:  09/17/24 212 lb 6.4 oz (96.3 kg)  07/21/23 179 lb 9.6 oz (81.5 kg)  02/04/23 180 lb 14.4 oz (82.1 kg)    Physical Exam Vitals and nursing note reviewed.  Constitutional:      General: He is awake. He is not in acute distress.    Appearance: He is well-developed and well-groomed. He is obese. He is not ill-appearing or toxic-appearing.  HENT:     Head: Normocephalic.     Right Ear: Hearing, ear canal and external ear normal. A middle ear effusion is present. There is no impacted cerumen. Tympanic membrane is not injected.     Left Ear: Hearing, ear canal and external ear normal. A middle ear effusion is present. There is no impacted cerumen. Tympanic membrane is not injected.     Nose: No rhinorrhea.     Right Sinus: No maxillary sinus tenderness or  frontal sinus tenderness.     Left Sinus: No maxillary sinus tenderness or frontal sinus tenderness.     Mouth/Throat:     Mouth: Mucous membranes are moist.     Pharynx: Posterior oropharyngeal erythema (mild) present. No pharyngeal swelling or oropharyngeal exudate.  Eyes:     General: Lids are normal.     Extraocular Movements: Extraocular movements intact.     Conjunctiva/sclera: Conjunctivae normal.  Neck:     Thyroid: No thyromegaly.     Vascular: No carotid bruit.  Cardiovascular:     Rate and Rhythm: Normal rate and regular  rhythm.     Heart sounds: Normal heart sounds. No murmur heard.    No gallop.  Pulmonary:     Effort: Pulmonary effort is normal. No accessory muscle usage or respiratory distress.     Breath sounds: Wheezing present. No decreased breath sounds or rales.     Comments: Expiratory wheezes noted throughout all lung fields. Abdominal:     General: Bowel sounds are normal. There is no distension.     Palpations: Abdomen is soft.     Tenderness: There is no abdominal tenderness.  Musculoskeletal:     Cervical back: Full passive range of motion without pain.     Right lower leg: No edema.     Left lower leg: No edema.  Lymphadenopathy:     Cervical: No cervical adenopathy.  Skin:    General: Skin is warm.     Capillary Refill: Capillary refill takes less than 2 seconds.  Neurological:     Mental Status: He is alert and oriented to person, place, and time.     Deep Tendon Reflexes: Reflexes are normal and symmetric.     Reflex Scores:      Brachioradialis reflexes are 2+ on the right side and 2+ on the left side.      Patellar reflexes are 2+ on the right side and 2+ on the left side. Psychiatric:        Attention and Perception: Attention normal.        Mood and Affect: Mood normal.        Speech: Speech normal.        Behavior: Behavior normal. Behavior is cooperative.        Thought Content: Thought content normal.    Results for orders placed or performed in visit on 07/21/23  HSV 1 and 2 Ab, IgG   Collection Time: 07/21/23  2:12 PM  Result Value Ref Range   HSV 1 Glycoprotein G Ab, IgG Reactive (A) Non Reactive   HSV 2 IgG, Type Spec Non Reactive Non Reactive  RPR   Collection Time: 07/21/23  2:12 PM  Result Value Ref Range   RPR Ser Ql Non Reactive Non Reactive  HIV Antibody (routine testing w rflx)   Collection Time: 07/21/23  2:12 PM  Result Value Ref Range   HIV Screen 4th Generation wRfx Non Reactive Non Reactive      Assessment & Plan:   Problem List Items  Addressed This Visit       Respiratory   Moderate persistent asthma without complication - Primary   Chronic, exacerbated. Poor control as unable to afford maintenance inhalers. Will place referral to [facility] to see if any assistance available or recommendations, discussed with him today. Continue Xopenex at this time. For exacerbation sent in Prednisone  40 MG daily for 5 days and Zpack. Tussionex for cough, no refills. Follow-up with PCP. Labs today.  Relevant Medications   predniSONE  (DELTASONE ) 20 MG tablet     Other   Screening examination for STI   Requesting STI screening today, all labs obtained.      Relevant Orders   GC/Chlamydia Probe Amp   HIV Antibody (routine testing w rflx)   RPR W/RFLX TO RPR TITER, TREPONEMAL AB, SCREEN AND DIAGNOSIS   HSV 1 and 2 Ab, IgG   Elevated cholesterol   Noted on past labs. Would like labs rechecked today. Lipid panel obtained.      Relevant Orders   Lipid Panel w/o Chol/HDL Ratio   Comprehensive metabolic panel with GFR     Follow up plan: Return in about 4 weeks (around 10/15/2024) for ASTHMA and see Darice.

## 2024-09-17 NOTE — Assessment & Plan Note (Signed)
 Requesting STI screening today, all labs obtained.

## 2024-09-17 NOTE — Telephone Encounter (Signed)
 FYI Only or Action Required?: FYI only for provider: appointment scheduled on this afternoon.  Patient was last seen in primary care on 07/21/2023 by Mathew Hyla Givens, NP.  Called Nurse Triage reporting Shortness of Breath.  Symptoms began ongoing - worsens in the winter - does not have daily inhaler d/t cost.  Interventions attempted: Other: rescue inhaler 10 times a day.  Symptoms are: gradually worsening.  Triage Disposition: See HCP Within 4 Hours (Or PCP Triage)  Patient/caregiver understands and will follow disposition?: Yes                 Copied from CRM #[id number]. Topic: Clinical - Red Word Triage >> Sep 17, 2024  1:24 PM Emylou G wrote: [facility] that prompted transfer to Nurse Triage: tightness of chest, shortness of breath, using his inhaler Reason for Disposition  [1] MILD difficulty breathing (e.g., minimal/no SOB at rest, SOB with walking, pulse < 100) AND [2] NEW-onset or WORSE than normal  Answer Assessment - Initial Assessment Questions 1. RESPIRATORY STATUS: Describe your breathing? (e.g., wheezing, shortness of breath, unable to speak, severe coughing)      SOB,  2. ONSET: When did this breathing problem begin?      Ongoing 3. PATTERN Does the difficult breathing come and go, or has it been constant since it started?      Comes and goes 4. SEVERITY: How bad is your breathing? (e.g., mild, moderate, severe)      mild 5. RECURRENT SYMPTOM: Have you had difficulty breathing before? If Yes, ask: When was the last time? and What happened that time?      yes 6. CARDIAC HISTORY: Do you have any history of heart disease? (e.g., heart attack, angina, bypass surgery, angioplasty)      Did not ask 7. LUNG HISTORY: Do you have any history of lung disease?  (e.g., pulmonary embolus, asthma, emphysema)     Asthma -  8. CAUSE: What do you think is causing the breathing problem?      Asthma, Bronchitis? 9. OTHER SYMPTOMS: Do you  have any other symptoms? (e.g., chest pain, cough, dizziness, fever, runny nose)     Cough  Protocols used: Breathing Difficulty-A-AH

## 2024-09-18 ENCOUNTER — Ambulatory Visit: Payer: Self-pay | Admitting: Nurse Practitioner

## 2024-09-18 LAB — LIPID PANEL W/O CHOL/HDL RATIO
Cholesterol, Total: 183 mg/dL (ref 100–199)
HDL: 71 mg/dL (ref >39)
LDL Chol Calc (NIH): 86 mg/dL (ref 0–99)
Triglycerides: 154 mg/dL — ABNORMAL HIGH (ref 0–149)
VLDL Cholesterol Cal: 26 mg/dL (ref 5–40)

## 2024-09-18 LAB — CBC WITH DIFFERENTIAL/PLATELET
Basophils Absolute: 0.1 x10E3/uL (ref 0.0–0.2)
Basos: 1 %
EOS (ABSOLUTE): 0.5 x10E3/uL — ABNORMAL HIGH (ref 0.0–0.4)
Eos: 11 %
Hematocrit: 48.4 % (ref 37.5–51.0)
Hemoglobin: 16.3 g/dL (ref 13.0–17.7)
Immature Grans (Abs): 0 x10E3/uL (ref 0.0–0.1)
Immature Granulocytes: 0 %
Lymphocytes Absolute: 2.1 x10E3/uL (ref 0.7–3.1)
Lymphs: 45 %
MCH: 30.4 pg (ref 26.6–33.0)
MCHC: 33.7 g/dL (ref 31.5–35.7)
MCV: 90 fL (ref 79–97)
Monocytes Absolute: 0.4 x10E3/uL (ref 0.1–0.9)
Monocytes: 9 %
Neutrophils Absolute: 1.6 x10E3/uL (ref 1.4–7.0)
Neutrophils: 34 %
Platelets: 321 x10E3/uL (ref 150–450)
RBC: 5.36 x10E6/uL (ref 4.14–5.80)
RDW: 14.1 % (ref 11.6–15.4)
WBC: 4.6 x10E3/uL (ref 3.4–10.8)

## 2024-09-18 LAB — COMPREHENSIVE METABOLIC PANEL WITH GFR
ALT: 67 IU/L — ABNORMAL HIGH (ref 0–44)
AST: 100 IU/L — ABNORMAL HIGH (ref 0–40)
Albumin: 5.1 g/dL (ref 4.1–5.1)
Alkaline Phosphatase: 79 IU/L (ref 47–123)
BUN/Creatinine Ratio: 18 (ref 9–20)
BUN: 24 mg/dL — ABNORMAL HIGH (ref 6–20)
Bilirubin Total: 0.4 mg/dL (ref 0.0–1.2)
CO2: 23 mmol/L (ref 20–29)
Calcium: 10.3 mg/dL — ABNORMAL HIGH (ref 8.7–10.2)
Chloride: 99 mmol/L (ref 96–106)
Creatinine, Ser: 1.35 mg/dL — ABNORMAL HIGH (ref 0.76–1.27)
Globulin, Total: 2.3 g/dL (ref 1.5–4.5)
Glucose: 94 mg/dL (ref 70–99)
Potassium: 4.6 mmol/L (ref 3.5–5.2)
Sodium: 138 mmol/L (ref 134–144)
Total Protein: 7.4 g/dL (ref 6.0–8.5)
eGFR: 72 mL/min/1.73 (ref >59)

## 2024-09-18 LAB — SYPHILIS: RPR W/REFLEX TO RPR TITER AND TREPONEMAL ANTIBODIES, TRADITIONAL SCREENING AND DIAGNOSIS ALGORITHM: RPR Ser Ql: NONREACTIVE

## 2024-09-18 LAB — TSH: TSH: 1.98 u[IU]/mL (ref 0.450–4.500)

## 2024-09-18 LAB — HIV ANTIBODY (ROUTINE TESTING W REFLEX): HIV Screen 4th Generation wRfx: NONREACTIVE

## 2024-09-18 LAB — HSV 1 AND 2 AB, IGG
HSV 1 Glycoprotein G Ab, IgG: REACTIVE — AB
HSV 2 IgG, Type Spec: NONREACTIVE

## 2024-09-18 NOTE — Progress Notes (Signed)
 Contacted via MyChart  Good morning Mathew Fitzpatrick, your labs have returned: - Herpes testing shows no genital herpes, but oral herpes (cold sores) remains positive. - HIV and syphilis are negative. - Kidney function, creatinine and eGFR, is stable. Liver function levels, AST and ALT, have trended up though. I recommend reduction of any Tylenol  or alcohol use if present and then rechecking at next visit to ensure these do not trend up more. - Lipid panel shows LDL, bad cholesterol, improved from past check. TSH, thyroid, is normal. CBC shows normal blood counts with exception of mild elevation in eosinophils which can be related to your asthma, we will work on a maintenance inhaler for you. Any questions? Keep being amazing!!  Thank you for allowing me to participate in your care.  I appreciate you. Kindest regards, Adrik Khim

## 2024-09-21 LAB — GC/CHLAMYDIA PROBE AMP
Chlamydia trachomatis, NAA: NEGATIVE
Neisseria Gonorrhoeae by PCR: NEGATIVE

## 2024-09-21 NOTE — Progress Notes (Signed)
Contacted via MyChart   Urine testing negative:)

## 2024-09-22 ENCOUNTER — Telehealth: Payer: Self-pay | Admitting: *Deleted

## 2024-09-22 NOTE — Progress Notes (Signed)
 Care Guide Pharmacy Note  09/22/2024 Name: Mathew Fitzpatrick MRN: [id number] DOB: 07-01-1991  Referred By: Melvin Pao, NP Reason for referral: Call Attempt #1 and Complex Care Management (Outreach to schedule referral with pharmacist )   Mathew Fitzpatrick is a 33 y.o. year old male who is a primary care patient of Melvin Pao, NP.  Mathew Fitzpatrick was referred to the pharmacist for assistance related to: asthma   Successful contact was made with the patient to discuss pharmacy services including being ready for the pharmacist to call at least 5 minutes before the scheduled appointment time and to have medication bottles and any blood pressure readings ready for review. The patient agreed to meet with the pharmacist via telephone visit on 09/27/2024  Mathew Fitzpatrick, CMA [place]  [facility], [facility] Guide Direct Dial: [phone]  Fax: [phone] Website: [email or web address]

## 2024-09-27 ENCOUNTER — Other Ambulatory Visit

## 2024-09-27 DIAGNOSIS — J4540 Moderate persistent asthma, uncomplicated: Secondary | ICD-10-CM

## 2024-09-27 MED ORDER — TIOTROPIUM BROMIDE 18 MCG IN CAPS: 18.0000 ug | ORAL_CAPSULE | Freq: Every day | RESPIRATORY_TRACT | 11 refills | Status: AC

## 2024-09-27 NOTE — Progress Notes (Signed)
" ° °  09/27/2024 Name: Mathew Fitzpatrick MRN: [id number] DOB: 30-Mar-1991  Chief Complaint  Patient presents with   Medication Assistance   Mathew Fitzpatrick is a 33 y.o. year old male who presented for a telephone visit.   They were referred to the pharmacist by their PCP for assistance in managing medication access.   Subjective:  Care Team: Primary Care Provider: Melvin Pao, NP ; Next Scheduled Visit: 10/19/24  Medication Access/Adherence  Current Pharmacy:  [id number], [place] - [address] [place] [address] Phone: [phone] Fax: [phone]  [id number] [place] - [address] [place] [address] Phone: [phone] Fax: [phone]  Patient reports affordability concerns with their medications: Yes  Patient reports access/transportation concerns to their pharmacy: No  Patient reports adherence concerns with their medications:  Yes    Moderate Persistent Asthma: Current medications:  Xopenex PRN -Prescribed Wixela and Spiriva  in the past, which provided benefit but were no longer affordable for patient -Recent exacerbation treated with Tussionex, prednisone , and azithromycin   Objective:  Lab Results  Component Value Date   CREATININE 1.35 (H) 09/17/2024   BUN 24 (H) 09/17/2024   NA 138 09/17/2024   K 4.6 09/17/2024   CL 99 09/17/2024   CO2 23 09/17/2024   Medications Reviewed Today     Reviewed by Deanna Channing LABOR, RPH (Pharmacist) on 09/27/24 at 1309  Med List Status: <None>   Medication Order Taking? Sig Documenting Provider Last Dose Status Informant  chlorpheniramine-HYDROcodone  (TUSSIONEX) 10-8 MG/5ML [id number]  Take 5 mLs by mouth at bedtime as needed. Cannady, Jolene T, NP  Active   levalbuterol (XOPENEX HFA) 45 MCG/ACT inhaler [id number]  INHALE 1 PUFF BY MOUTH INTO LUNGS EVERY 8 HOURS AS NEEDED FOR WHEEZING Melvin Pao, NP  Active            Assessment/Plan:    Moderate Persistent Asthma: -Reviewed patient's insurance formulary, and Wixela and Spiriva  are tier 1 (cheapest medication options) -[facility] [place], and [facility] had an active prescription for Wixela 250/50 BID on file but no prescription for Spiriva .  Pharmacy processed [place], and this went through for $0 and will be ready for pick up later today or tomorrow. -Since Spiriva  is listed as same tier as Wixela, this should also be covered by insurance at $0 copay; [facility] if provider in agreement.  I will contact the pharmacy to verify coverage and copay.   -I recommend patient resume daily maintenance inhaler use with Wixela 250/50 BID and Spiriva  QD  Follow Up Plan: Telephone follow-up scheduled for 4pm to discuss coverage of inhalers   Channing LABOR Deanna, PharmD, DPLA    "

## 2024-09-28 ENCOUNTER — Other Ambulatory Visit

## 2024-09-28 DIAGNOSIS — J4540 Moderate persistent asthma, uncomplicated: Secondary | ICD-10-CM

## 2024-09-28 NOTE — Progress Notes (Signed)
" ° °  09/28/2024 Name: NOAH LEMBKE MRN: [id number] DOB: Jun 08, 1991  Kodie R Matousek is a 33 y.o. year old male who presented for a telephone follow-up visit.   They were referred to the pharmacist by their PCP for assistance in managing medication access.   Subjective:  Care Team: Primary Care Provider: Melvin Pao, NP ; Next Scheduled Visit: 10/19/24   Moderate Persistent Asthma: Current medications:  Xopenex PRN -[facility], and both Wixela 250/50 and generic Spiriva  are ready for pick up for a total of $2.19 on patient's insurance  Objective:  Lab Results  Component Value Date   CREATININE 1.35 (H) 09/17/2024   BUN 24 (H) 09/17/2024   NA 138 09/17/2024   K 4.6 09/17/2024   CL 99 09/17/2024   CO2 23 09/17/2024   Assessment/Plan:   Moderate Persistent Asthma: -Informed patient that medications are ready for pick up and what his cost would be; this is affordable for him, and he plans to pick up ASAP -Advised patient to use 1 puff of Wixela twice daily everyday and to rinse mouth out after each use -Advised to also use 1 puff of Spiriva  daily everyday, as these medications are meant to help prevent flares -Patient endorsed understanding  Channing DELENA Mealing, PharmD, DPLA    "

## 2024-10-19 ENCOUNTER — Ambulatory Visit: Admitting: Nurse Practitioner

## 2024-10-19 NOTE — Progress Notes (Unsigned)
 "  There were no vitals taken for this visit.   Subjective:    Patient ID: Mathew Fitzpatrick, male    DOB: Aug 10, 1991, 34 y.o.   MRN: [id number]  HPI: Mathew Fitzpatrick is a 34 y.o. male  No chief complaint on file.  ASTHMA Asthma status: {Blank single:19197::controlled,uncontrolled,better,worse,exacerbated,stable} Satisfied with current treatment?: {Blank single:19197::yes,no} Albuterol /rescue inhaler frequency:  Dyspnea frequency:  Wheezing frequency: Cough frequency:  Nocturnal symptom frequency:  Limitation of activity: {Blank single:19197::yes,no} Current upper respiratory symptoms: {Blank single:19197::yes,no} Triggers:  Home peak flows: Last Spirometry:  Failed/intolerant to following asthma meds:  Asthma meds in past:  Aerochamber/spacer use: {Blank single:19197::yes,no} Visits to ER or Urgent Care in past year: {Blank single:19197::yes,no} Pneumovax: {Blank single:19197::Up to Date,Not up to Date,unknown} Influenza: {Blank single:19197::Up to Date,Not up to Date,unknown}  Relevant past medical, surgical, family and social history reviewed and updated as indicated. Interim medical history since our last visit reviewed. Allergies and medications reviewed and updated.  Review of Systems  Per HPI unless specifically indicated above     Objective:    There were no vitals taken for this visit.  Wt Readings from Last 3 Encounters:  09/17/24 212 lb 6.4 oz (96.3 kg)  07/21/23 179 lb 9.6 oz (81.5 kg)  02/04/23 180 lb 14.4 oz (82.1 kg)    Physical Exam  Results for orders placed or performed in visit on 09/17/24  HIV Antibody (routine testing w rflx)   Collection Time: 09/17/24  4:29 PM  Result Value Ref Range   HIV Screen 4th Generation wRfx Non Reactive Non Reactive  RPR W/RFLX TO RPR TITER, TREPONEMAL AB, SCREEN AND DIAGNOSIS   Collection Time: 09/17/24  4:29 PM  Result Value Ref Range   RPR Ser Ql Non Reactive Non  Reactive  HSV 1 and 2 Ab, IgG   Collection Time: 09/17/24  4:29 PM  Result Value Ref Range   HSV 1 Glycoprotein G Ab, IgG Reactive (A) Non Reactive   HSV 2 IgG, Type Spec Non Reactive Non Reactive  Lipid Panel w/o Chol/HDL Ratio   Collection Time: 09/17/24  4:29 PM  Result Value Ref Range   Cholesterol, Total 183 100 - 199 mg/dL   Triglycerides 845 (H) 0 - 149 mg/dL   HDL 71 >60 mg/dL   VLDL Cholesterol Cal 26 5 - 40 mg/dL   LDL Chol Calc (NIH) 86 0 - 99 mg/dL  Comprehensive metabolic panel with GFR   Collection Time: 09/17/24  4:29 PM  Result Value Ref Range   Glucose 94 70 - 99 mg/dL   BUN 24 (H) 6 - 20 mg/dL   Creatinine, Ser 8.64 (H) 0.76 - 1.27 mg/dL   eGFR 72 >40 fO/fpw/8.26   BUN/Creatinine Ratio 18 9 - 20   Sodium 138 134 - 144 mmol/L   Potassium 4.6 3.5 - 5.2 mmol/L   Chloride 99 96 - 106 mmol/L   CO2 23 20 - 29 mmol/L   Calcium 10.3 (H) 8.7 - 10.2 mg/dL   Total Protein 7.4 6.0 - 8.5 g/dL   Albumin 5.1 4.1 - 5.1 g/dL   Globulin, Total 2.3 1.5 - 4.5 g/dL   Bilirubin Total 0.4 0.0 - 1.2 mg/dL   Alkaline Phosphatase 79 47 - 123 IU/L   AST 100 (H) 0 - 40 IU/L   ALT 67 (H) 0 - 44 IU/L  CBC with Differential/Platelet   Collection Time: 09/17/24  4:29 PM  Result Value Ref Range   WBC 4.6 3.4 - 10.8 x10E3/uL  RBC 5.36 4.14 - 5.80 x10E6/uL   Hemoglobin 16.3 13.0 - 17.7 g/dL   Hematocrit 51.5 62.4 - 51.0 %   MCV 90 79 - 97 fL   MCH 30.4 26.6 - 33.0 pg   MCHC 33.7 31.5 - 35.7 g/dL   RDW 85.8 88.3 - 84.5 %   Platelets 321 150 - 450 x10E3/uL   Neutrophils 34 Not Estab. %   Lymphs 45 Not Estab. %   Monocytes 9 Not Estab. %   Eos 11 Not Estab. %   Basos 1 Not Estab. %   Neutrophils Absolute 1.6 1.4 - 7.0 x10E3/uL   Lymphocytes Absolute 2.1 0.7 - 3.1 x10E3/uL   Monocytes Absolute 0.4 0.1 - 0.9 x10E3/uL   EOS (ABSOLUTE) 0.5 (H) 0.0 - 0.4 x10E3/uL   Basophils Absolute 0.1 0.0 - 0.2 x10E3/uL   Immature Granulocytes 0 Not Estab. %   Immature Grans (Abs) 0.0 0.0 - 0.1  x10E3/uL  TSH   Collection Time: 09/17/24  4:29 PM  Result Value Ref Range   TSH 1.980 0.450 - 4.500 uIU/mL  GC/Chlamydia Probe Amp   Collection Time: 09/17/24  4:50 PM   Specimen: Urine   UR  Result Value Ref Range   Chlamydia trachomatis, NAA Negative Negative   Neisseria Gonorrhoeae by PCR Negative Negative      Assessment & Plan:   Problem List Items Addressed This Visit   None    Follow up plan: No follow-ups on file.      "
